# Patient Record
Sex: Female | Born: 1945
Health system: Southern US, Community
[De-identification: ages and names within clinical notes are randomized; demographics above are authoritative.]

## PROBLEM LIST (undated history)

## (undated) DIAGNOSIS — L509 Urticaria, unspecified: Secondary | ICD-10-CM

## (undated) DIAGNOSIS — J45909 Unspecified asthma, uncomplicated: Secondary | ICD-10-CM

## (undated) HISTORY — DX: Urticaria, unspecified: L50.9

## (undated) HISTORY — PX: OTHER SURGICAL HISTORY: SHX169

## (undated) HISTORY — PX: BREAST EXCISIONAL BIOPSY: SUR124

## (undated) HISTORY — PX: FOOT SURGERY: SHX648

## (undated) HISTORY — DX: Unspecified asthma, uncomplicated: J45.909

## (undated) HISTORY — PX: BREAST BIOPSY: SHX20

---

## 2013-09-29 ENCOUNTER — Encounter: Payer: Self-pay | Admitting: Gastroenterology

## 2016-04-24 DIAGNOSIS — J3089 Other allergic rhinitis: Secondary | ICD-10-CM | POA: Insufficient documentation

## 2016-04-24 DIAGNOSIS — J4489 Other specified chronic obstructive pulmonary disease: Secondary | ICD-10-CM | POA: Insufficient documentation

## 2016-04-24 DIAGNOSIS — K219 Gastro-esophageal reflux disease without esophagitis: Secondary | ICD-10-CM | POA: Insufficient documentation

## 2016-04-24 DIAGNOSIS — J449 Chronic obstructive pulmonary disease, unspecified: Secondary | ICD-10-CM | POA: Insufficient documentation

## 2016-05-21 DIAGNOSIS — E039 Hypothyroidism, unspecified: Secondary | ICD-10-CM | POA: Insufficient documentation

## 2016-05-21 DIAGNOSIS — E038 Other specified hypothyroidism: Secondary | ICD-10-CM | POA: Insufficient documentation

## 2017-06-04 DIAGNOSIS — Z86010 Personal history of colonic polyps: Secondary | ICD-10-CM | POA: Insufficient documentation

## 2018-04-03 ENCOUNTER — Encounter: Payer: Self-pay | Admitting: Gastroenterology

## 2018-12-18 DIAGNOSIS — M5412 Radiculopathy, cervical region: Secondary | ICD-10-CM | POA: Insufficient documentation

## 2018-12-30 ENCOUNTER — Telehealth: Payer: Self-pay | Admitting: Allergy

## 2018-12-30 NOTE — Telephone Encounter (Signed)
Spoke with patient and explained what to take and what not to take before apt.

## 2018-12-30 NOTE — Telephone Encounter (Signed)
PT called in to ask about which meds to stop taking for her NEW PT appt with Dr Selena BattenKim. The pharmacist told her to stop using the Fluticosine spray and Allegra, but was told to ask us about stopping the Singulair and [employer] Pepcid omeprazole. Please call back PT to advise. Ok to leave voicemail.

## 2019-01-09 ENCOUNTER — Ambulatory Visit (INDEPENDENT_AMBULATORY_CARE_PROVIDER_SITE_OTHER): Payer: Medicare Other | Admitting: Allergy

## 2019-01-09 ENCOUNTER — Encounter: Payer: Self-pay | Admitting: Allergy

## 2019-01-09 VITALS — BP 144/80 | HR 69 | Temp 98.1°F | Resp 18 | Ht 62.72 in | Wt 154.8 lb

## 2019-01-09 DIAGNOSIS — J310 Chronic rhinitis: Secondary | ICD-10-CM

## 2019-01-09 DIAGNOSIS — Z8709 Personal history of other diseases of the respiratory system: Secondary | ICD-10-CM | POA: Insufficient documentation

## 2019-01-09 DIAGNOSIS — Z130 Encounter for screening for diseases of the blood and blood-forming organs and certain disorders involving the immune mechanism: Secondary | ICD-10-CM | POA: Diagnosis not present

## 2019-01-09 DIAGNOSIS — J4540 Moderate persistent asthma, uncomplicated: Secondary | ICD-10-CM

## 2019-01-09 NOTE — Assessment & Plan Note (Signed)
Diagnosed with asthma about 8 years ago but the last year noticing frequent flares the past year requiring almost monthly prednisone and antibiotics since June 2019. Currently on Symbicort 160 2 puffs BID, albuterol prn, Flonase 2 sprays BID, allegra daily and Singulair daily. She follows with pulmonology. ENT evaluation showed reflux and now on PPI. 2019 CXR was unremarkable. No cardiac work up.   Today's spirometry was normal.  Today's skin testing was minimally positive to aspergillus and penicillium mix.   Discussed with patient that not sure if there is an allergic trigger to her worsening symptoms. Will get some additional bloodwork as below and will make recommendations based on results.  Give her frequent infections will also check some basic immune bloodwork.  Continue all your inhalers as per your pulmonologist.  Continue Singulair daily.  Continue Prilosec daily.

## 2019-01-09 NOTE — Patient Instructions (Addendum)
Today's testing showed: Mildly positive to mold.  Keep track of infections. Get bloodwork   Continue all your inhalers as per your pulmonologist. Continue singulair daily. Continue prilosec daily/ Continue allegra daily Continue Flonase 2 sprays daily  Follow up in 2 months  Mold Control . Mold and fungi can grow on a variety of surfaces provided certain temperature and moisture conditions exist.  . Outdoor molds grow on plants, decaying vegetation and soil. The major outdoor mold, Alternaria and Cladosporium, are found in very high numbers during hot and dry conditions. Generally, a late summer - fall peak is seen for common outdoor fungal spores. Rain will temporarily lower outdoor mold spore count, but counts rise rapidly when the rainy period ends. . The most important indoor molds are Aspergillus and Penicillium. Dark, humid and poorly ventilated basements are ideal sites for mold growth. The next most common sites of mold growth are the bathroom and the kitchen. Indoor (Perennial) Mold Control  . Maintain humidity below 50%. . Get rid of mold growth on hard surfaces with water, detergent and, if necessary, 5% bleach (do not mix with other cleaners). Then dry the area completely. If mold covers an area more than 10 square feet, consider hiring an indoor environmental professional. . For clothing, washing with soap and water is best. If moldy items cannot be cleaned and dried, throw them away. . Remove sources e.g. contaminated carpets. . Repair and seal leaking roofs or pipes. Using dehumidifiers in damp basements may be helpful, but empty the water and clean units regularly to prevent mildew from forming. All rooms, especially basements, bathrooms and kitchens, require ventilation and cleaning to deter mold and mildew growth. Avoid carpeting on concrete or damp floors, and storing items in damp areas.

## 2019-01-09 NOTE — Assessment & Plan Note (Signed)
.   See assessment and plan as above. 

## 2019-01-09 NOTE — Assessment & Plan Note (Signed)
Perennial nasal congestion for the past few years. Evaluated by ENT in the past.   Today's testing showed: Mildly positive to mold. Discussed environmental control measures.   Continue Singulair daily.  Continue allegra daily.  Continue Flonase 2 sprays daily.  Nasal saline spray (i.e., Simply Saline) or nasal saline lavage (i.e., NeilMed) is recommended as needed and prior to medicated nasal sprays.

## 2019-01-09 NOTE — Progress Notes (Signed)
New Patient Note  RE: Chloe Hoover MRN: [id number] DOB: February 01, 1946 Date of Office Visit: 01/09/2019  Referring provider: No ref. provider found Primary care provider: [name], MD  Chief Complaint: Referral (pulmonogist)  History of Present Illness: I had the pleasure of seeing Chloe Hoover for initial evaluation at the [facility] on 01/09/2019. She is a 73 y.o. female, who is referred here by [name], MD for the evaluation of recurrent breathing issues/allergy testing.  She reports symptoms of chest tightness, shortness of breath, coughing with thick phlegm, wheezing, nocturnal awakenings for 8 years but worsening symptoms the past year. Current medications include Symbicort 160 2 puffs BID and albuterol prn with minimal benefit. She reports not using aerochamber with asthma inhalers. She tried the following inhalers: breo, Advair. Main asthma triggers are unknown but worse with exertion. In the last month, frequency of asthma symptoms: depends on flares. Frequency of nocturnal symptoms: 0x/month. Frequency of SABA use: depends on flares. Interference with physical activity: yes. Sleep is disturbed. In the last 12 months, emergency room visits/urgent care visits/doctor office visits or hospitalizations due to asthma: multiple times at least once a month from June to December. In the last 12 months, oral steroids courses: multiple times 4-6?[name] Lifetime history of hospitalization for asthma: no. Prior intubations: no. History of pneumonia: no. She was evaluated pulmonologist in the past. Smoking exposure: yes, quit in 1985. Up to date with flu vaccine: yes. Up to date with pneumonia vaccine: yes.  No recent cardiac evaluation.   Recent CXR in 2019 was normal.  She reports symptoms of nasal congestion in the morning. Symptoms have been going on for a few years. The symptoms are present all year around with worsening in the fall and spring. Anosmia: no.  Headache: no.  She has used saline spray, Flonase 2 sprays BID, allegra, Singulair with some improvement in symptoms. Sinus infections: no. Previous work up includes: 2017 bloodwork was negative.  Previous ENT evaluation: yes but was told she had acid reflux. Previous sinus imaging: no.  Patient does have reflux and taking Prilosec and Pepcid BID with good benefit.   Assessment and Plan: Chloe Hoover is a 73 y.o. female with: Moderate persistent asthma without complication Diagnosed with asthma about 8 years ago but the last year noticing frequent flares the past year requiring almost monthly prednisone and antibiotics since June 2019. Currently on Symbicort 160 2 puffs BID, albuterol prn, Flonase 2 sprays BID, allegra daily and Singulair daily. She follows with pulmonology. ENT evaluation showed reflux and now on PPI. 2019 CXR was unremarkable. No cardiac work up.   Today's spirometry was normal.  Today's skin testing was minimally positive to aspergillus and penicillium mix.   Discussed with patient that not sure if there is an allergic trigger to her worsening symptoms. Will get some additional bloodwork as below and will make recommendations based on results.  Give her frequent infections will also check some basic immune bloodwork.  Continue all your inhalers as per your pulmonologist.  Continue Singulair daily.  Continue Prilosec daily.  History of frequent upper respiratory infection See assessment and plan as above.  Chronic rhinitis Perennial nasal congestion for the past few years. Evaluated by ENT in the past.   Today's testing showed: Mildly positive to mold. Discussed environmental control measures.   Continue Singulair daily.  Continue allegra daily.  Continue Flonase 2 sprays daily.  Nasal saline spray (i.e., Simply Saline) or nasal saline lavage (i.e., NeilMed) is  recommended as needed and prior to medicated nasal sprays.  Return in about 2 months (around  03/10/2019).  Lab Orders     CBC with Differential     IgG, IgA, IgM     Strep pneumoniae 23 Serotypes IgG     Tetanus antibody, IgG     [id number] Aspergillus fumigatus     IgE  Other allergy screening: Food allergy: no Medication allergy: yes  Codeine - drowsiness Hymenoptera allergy: no Urticaria: no Eczema:no History of recurrent infections suggestive of immunodeficency:   Patient has history multiple infections including bronchitis. Denies any sinus infection, pneumonia, ear infections, GI infections/diarrhea, skin infections/abscesses, fungal infections, viral infections.   Patient reports 6 antibiotic use in the last 12 months and 0 hospital admissions. Patient does not have any secondary causes of immunodeficiency including chronic steroid use, diabetes mellitus, protein losing enteropathy, renal or hepatic dysfunction, history of cancer or irradiation or history of HIV, hepatitis B or C. Patient did have melanoma of the skin in 2008  Diagnostics: Spirometry:  Tracings reviewed. Her effort: Good reproducible efforts. FVC: 2.71L FEV1: 2.08L, 96% predicted FEV1/FVC ratio: 77% Interpretation: Spirometry consistent with normal pattern.  Please see scanned spirometry results for details.  Skin Testing: Environmental allergy panel. Mildly positive to mold - aspergillus and penicillium mix. Results discussed with patient/family. Airborne Adult Perc - 01/09/19 0941    Time Antigen Placed  0945    Allergen Manufacturer  Waynette ButteryGreer    Location  Back    Number of Test  59    Panel 1  Select    1. Control-Buffer 50% Glycerol  Negative    2. Control-Histamine 1 mg/ml  4+    3. Albumin saline  Negative    4. Bahia  Negative    5. French Southern Territories[place]ermuda  Negative    6. Johnson  Negative    7. Kentucky Blue  Negative    8. Meadow Fescue  Negative    9. Perennial Rye  Negative    10. Sweet Vernal  Negative    11. Timothy  Negative    12. Cocklebur  Negative    13. Burweed Marshelder  Negative     14. Ragweed, short  Negative    15. Ragweed, Giant  Negative    16. Plantain,  English  Negative    17. Lamb's Quarters  Negative    18. Sheep Sorrell  Negative    19. Rough Pigweed  Negative    20. Marsh Elder, Rough  Negative    21. Mugwort, Common  Negative    22. Ash mix  Negative    23. Birch mix  Negative    24. Beech American  Negative    25. Box, Elder  Negative    26. Cedar, red  Negative    27. Cottonwood, Guinea-BissauEastern  Negative    28. Elm mix  Negative    29. Hickory mix  Negative    30. Maple mix  Negative    31. Oak, Guinea-Bissau[place]astern mix  Negative    32. Pecan Pollen  Negative    33. Pine mix  Negative    34. Sycamore Eastern  Negative    35. Walnut, Black Pollen  Negative    36. Alternaria alternata  Negative    37. Cladosporium Herbarum  Negative    38. Aspergillus mix  Negative    39. Penicillium mix  Negative    40. Bipolaris sorokiniana (Helminthosporium)  Negative    41. Drechslera spicifera (Curvularia)  Negative    42. Mucor plumbeus  Negative    43. Fusarium moniliforme  Negative    44. Aureobasidium pullulans (pullulara)  Negative    45. Rhizopus oryzae  Negative    46. Botrytis cinera  Negative    47. Epicoccum nigrum  Negative    48. Phoma betae  Negative    49. Candida Albicans  Negative    50. Trichophyton mentagrophytes  Negative    51. Mite, D Farinae  5,000 AU/ml  Negative    52. Mite, D Pteronyssinus  5,000 AU/ml  Negative    53. Cat Hair 10,000 BAU/ml  Negative    54.  Dog Epithelia  Negative    55. Mixed Feathers  Negative    56. Horse Epithelia  Negative    57. Cockroach, German  Negative    58. Mouse  Negative    59. Tobacco Leaf  Negative     Food Perc - 01/09/19 0941    Time Antigen Placed  0945    Allergen Manufacturer  Waynette Buttery    Location  Back    Number of allergen test  10    Food  Select    1. Peanut  Negative    2. Soybean food  Negative    3. Wheat, whole  Negative    4. Sesame  Negative    5. Milk, cow  Negative    6. Egg White,  chicken  Negative    7. Casein  Negative    8. Shellfish mix  Negative    9. Fish mix  Negative    10. Cashew  Negative     Intradermal - 01/09/19 1008    Time Antigen Placed  1010    Allergen Manufacturer  Greer    Location  Arm    Number of Test  15    Control  Negative    French Southern Territories  Negative    Johnson  Negative    7 Grass  Negative    Ragweed mix  Negative    Weed mix  Negative    Tree mix  Negative    Mold 1  Negative    Mold 2  2+    Mold 3  Negative    Mold 4  Negative    Cat  Negative    Dog  Negative    Cockroach  Negative    Mite mix  Negative       Past Medical History: Patient Active Problem List   Diagnosis Date Noted  . History of frequent upper respiratory infection 01/09/2019  . Encounter for screening for diseases of the blood and blood-forming organs and certain disorders involving the immune mechanism 01/09/2019  . Moderate persistent asthma without complication 01/09/2019  . Chronic rhinitis 01/09/2019   Past Medical History:  Diagnosis Date  . Asthma   . Urticaria    Past Surgical History: History reviewed. No pertinent surgical history. Medication List:  Current Outpatient Medications  Medication Sig Dispense Refill  . albuterol (PROVENTIL) (2.5 MG/3ML) 0.083% nebulizer solution Inhale into the lungs.    [name] albuterol (PROVENTIL) (2.5 MG/3ML) 0.083% nebulizer solution USE 3 ML IN NEBULIZER EVERY 6 HOURS AS NEEDED    . budesonide-formoterol (SYMBICORT) 160-4.5 MCG/ACT inhaler Inhale into the lungs.    . Cholecalciferol (VITAMIN D3) 50 MCG (2000 UT) capsule Take by mouth.    . fenofibrate 160 MG tablet TAKE 1 TABLET BY MOUTH ONCE DAILY    . fexofenadine (ALLEGRA) 180 MG tablet  TAKE 1 TABLET BY MOUTH ONCE DAILY    . fluticasone (FLONASE) 50 MCG/ACT nasal spray USE 2 SPRAY(S) IN EACH NOSTRIL TWICE DAILY    . montelukast (SINGULAIR) 10 MG tablet TAKE 1 TABLET BY MOUTH ONCE DAILY IN THE EVENING    . Multiple Vitamin (MULTIVITAMIN) capsule Take by  mouth.    [name] omeprazole (PRILOSEC) 20 MG capsule TAKE 1 CAPSULE BY MOUTH ONCE DAILY    . simvastatin (ZOCOR) 40 MG tablet TAKE ONE TABLET BY MOUTH ONCE DAILY IN THE EVENING     No current facility-administered medications for this visit.    Allergies: Allergies  Allergen Reactions  . Codeine Hives   Social History: Social History   Socioeconomic History  . Marital status: Single    Spouse name: Not on file  . Number of children: Not on file  . Years of education: Not on file  . Highest education level: Not on file  Occupational History  . Not on file  Social Needs  . Financial resource strain: Not on file  . Food insecurity:    Worry: Not on file    Inability: Not on file  . Transportation needs:    Medical: Not on file    Non-medical: Not on file  Tobacco Use  . Smoking status: Former Smoker    Types: Cigarettes    Last attempt to quit: 07/17/1984    Years since quitting: 34.5  . Smokeless tobacco: Never Used  Substance and Sexual Activity  . Alcohol use: Not on file  . Drug use: Not on file  . Sexual activity: Not on file  Lifestyle  . Physical activity:    Days per week: Not on file    Minutes per session: Not on file  . Stress: Not on file  Relationships  . Social connections:    Talks on phone: Not on file    Gets together: Not on file    Attends religious service: Not on file    Active member of club or organization: Not on file    Attends meetings of clubs or organizations: Not on file    Relationship status: Not on file  Other Topics Concern  . Not on file  Social History Narrative  . Not on file   Lives in a 1950s built home. Smoking: smoked from 1964 to 1985.  Occupation: retired in 2012  Environmental History: Water Damage/mildew in the house: yes Carpet in the family room: yes Carpet in the bedroom: yes Heating: gas Cooling: central Pet: no  Family History: Family History  Problem Relation Age of Onset  . Allergic rhinitis Brother   .  Asthma Neg Hx   . Eczema Neg Hx   . Urticaria Neg Hx    Review of Systems  Constitutional: Negative for appetite change, chills, fever and unexpected weight change.  HENT: Positive for congestion. Negative for rhinorrhea.   Eyes: Negative for itching.  Respiratory: Positive for cough, chest tightness and shortness of breath. Negative for wheezing.   Cardiovascular: Negative for chest pain.  Gastrointestinal: Negative for abdominal pain.  Genitourinary: Negative for difficulty urinating.  Skin: Negative for rash.  Allergic/Immunologic: Positive for environmental allergies. Negative for food allergies.  Neurological: Negative for headaches.   Objective: BP (!) 144/80 (BP Location: Right Arm, Patient Position: Sitting, Cuff Size: Normal)   Pulse 69   Temp 98.1 F (36.7 C) (Oral)   Resp 18   Ht 5' 2.72" (1.593 m)   Wt 154 lb 12.8 oz (  70.2 kg)   SpO2 95%   BMI 27.67 kg/m  Body mass index is 27.67 kg/m. Physical Exam  Constitutional: She is oriented to person, place, and time. She appears well-developed and well-nourished.  HENT:  Head: Normocephalic and atraumatic.  Right Ear: External ear normal.  Left Ear: External ear normal.  Nose: Nose normal.  Mouth/Throat: Oropharynx is clear and moist.  Eyes: Conjunctivae and EOM are normal.  Neck: Neck supple.  Cardiovascular: Normal rate, regular rhythm and normal heart sounds. Exam reveals no gallop and no friction rub.  No murmur heard. Pulmonary/Chest: Effort normal and breath sounds normal. She has no wheezes. She has no rales.  Abdominal: Soft.  Lymphadenopathy:    She has no cervical adenopathy.  Neurological: She is alert and oriented to person, place, and time.  Skin: Skin is warm. No rash noted.  Psychiatric: She has a normal mood and affect. Her behavior is normal.  Nursing note and vitals reviewed.  The plan was reviewed with the patient/family, and all questions/concerned were addressed.  It was my pleasure to see  Chloe DandyMary today and participate in her care. Please feel free to contact me with any questions or concerns.  Sincerely,  Wyline MoodYoon Kim, DO Allergy & Immunology  [facility] [place] office: [phone] [facility] office: [phone]

## 2019-01-12 LAB — M003-IGE ASPERGILLUS FUMIGATUS: Aspergillus Fumigatus IgE: <0.1 kU/L

## 2019-01-12 LAB — IGE: IgE (Immunoglobulin E), Serum: 42 IU/mL (ref 6–495)

## 2019-01-16 ENCOUNTER — Ambulatory Visit (INDEPENDENT_AMBULATORY_CARE_PROVIDER_SITE_OTHER): Payer: Medicare Other | Admitting: Allergy

## 2019-01-16 ENCOUNTER — Encounter: Payer: Self-pay | Admitting: Allergy

## 2019-01-16 VITALS — BP 120/62 | HR 66 | Temp 98.4°F | Resp 20

## 2019-01-16 DIAGNOSIS — J069 Acute upper respiratory infection, unspecified: Secondary | ICD-10-CM

## 2019-01-16 DIAGNOSIS — Z8709 Personal history of other diseases of the respiratory system: Secondary | ICD-10-CM

## 2019-01-16 DIAGNOSIS — J4540 Moderate persistent asthma, uncomplicated: Secondary | ICD-10-CM

## 2019-01-16 DIAGNOSIS — J310 Chronic rhinitis: Secondary | ICD-10-CM | POA: Diagnosis not present

## 2019-01-16 NOTE — Assessment & Plan Note (Signed)
Past history - Diagnosed with asthma about 8 years ago but the last year noticing frequent flares the past year requiring almost monthly prednisone and antibiotics since June 2019. Currently on Symbicort 160 2 puffs BID, albuterol prn, Flonase 2 sprays BID, allegra daily and Singulair daily. She follows with pulmonology. ENT evaluation showed reflux and now on PPI. 2019 CXR was unremarkable. No cardiac work up.  Interim history - No flare with current URI.   Today's spirometry was normal.  Continue all your inhalers as per your pulmonologist.  Continue Singulair daily.  Continue Prilosec daily.

## 2019-01-16 NOTE — Assessment & Plan Note (Signed)
I did not get back all the bloodwork and will call once they all have resulted.  Keep track of infections.

## 2019-01-16 NOTE — Patient Instructions (Addendum)
You most likely have a common viral cold.  You may take a decongestant such as allegra-D for the next few days to help with the congestion.  Use saline nettipot 1-2 times a day for the next week or so until everything clears up.  Continue Flonase 2 sprays daily.  Moderate persistent asthma without complication  Continue all your inhalers as per your pulmonologist.  Continue Singulair daily.  Continue Prilosec daily.  Follow up in 2 months   Drink plenty of fluids.  Water, juice, clear broth or warm lemon water are good choices. Avoid caffeine and alcohol, which can dehydrate you.  Eat chicken soup.  Chicken soup and other warm fluids can be soothing and loosen congestion.  Rest.  Adjust your room's temperature and humidity.  Keep your room warm but not overheated. If the air is dry, a cool-mist humidifier or vaporizer can moisten the air and help ease congestion and coughing. Keep the humidifier clean to prevent the growth of bacteria and molds.  Soothe your throat.  Perform a saltwater gargle. Dissolve one-quarter to a half teaspoon of salt in a 4- to 8-ounce glass of warm water. This can relieve a sore or scratchy throat temporarily.  Use saline nasal drops.  To help relieve nasal congestion, try saline nasal drops. You can buy these drops over the counter, and they can help relieve symptoms ? even in children.  Take over-the-counter cold and cough medications.  For adults and children older than 5, over-the-counter decongestants, antihistamines and pain relievers might offer some symptom relief. However, they won't prevent a cold or shorten its duration.

## 2019-01-16 NOTE — Progress Notes (Signed)
Follow Up Note  RE: Chloe Hoover MRN: [id number] DOB: 12-02-1946 Date of Office Visit: 01/16/2019  Referring provider: Brooke BonitoGallemore, Warren, MD Primary care provider: Brooke BonitoGallemore, Warren, MD  Chief Complaint: Asthma  History of Present Illness: I had the pleasure of seeing Chloe Hoover for a follow up visit at the [facility] on 01/16/2019. She is a 73 y.o. female, who is being followed for frequent URIs, asthma, chronic rhinitis. Today she is here for new complaint of URI symptoms. Her previous allergy office visit was on 01/09/2019 with Dr. Selena BattenKim.   On Sunday afternoon patient was watching TV with her family when she noticed some tickling in her throat and started coughing. No fevers but on Monday she had a dry cough with sore throat and stuffy nose. On Tuesday patient had a fever of 101.8 and she had some headaches, sneezing.  She does not feel like her lungs are inflamed but seems very stuffy in her sinuses and ears. No more fevers. No sick contacts. Symptoms seem to be getting better.   Currently on Singulair, allegra, nasal spray 2 sprays daily and all inhalers.  Assessment and Plan: [name] is a 73 y.o. female with: Viral upper respiratory infection Patient most likely has a viral URI. No indication for any antibiotics.   Gave handout on proper care.  May take a decongestant such as allegra-D for the next few days to help with the congestion.  Use saline nettipot 1-2 times a day for the next week or so until everything clears up.  Continue Flonase 2 sprays daily.  History of frequent upper respiratory infection I did not get back all the bloodwork and will call once they all have resulted.  Keep track of infections.  Chronic rhinitis Past history - Perennial nasal congestion for the past few years. Evaluated by ENT in the past. 2020 testing showed: Mildly positive to mold.   Continue Singulair daily.  Continue allegra daily.  Continue Flonase 2 sprays  daily.  Nasal saline spray (i.e., Simply Saline) or nasal saline lavage (i.e., NeilMed) is recommended as needed and prior to medicated nasal sprays.  Moderate persistent asthma without complication Past history - Diagnosed with asthma about 8 years ago but the last year noticing frequent flares the past year requiring almost monthly prednisone and antibiotics since June 2019. Currently on Symbicort 160 2 puffs BID, albuterol prn, Flonase 2 sprays BID, allegra daily and Singulair daily. She follows with pulmonology. ENT evaluation showed reflux and now on PPI. 2019 CXR was unremarkable. No cardiac work up.  Interim history - No flare with current URI.   Today's spirometry was normal.  Continue all your inhalers as per your pulmonologist.  Continue Singulair daily.  Continue Prilosec daily.  Return in about 2 months (around 03/17/2019).  Diagnostics: Spirometry:  Tracings reviewed. Her effort: Good reproducible efforts. FVC: 2.84L FEV1: 2.25L, 104% predicted FEV1/FVC ratio: 79% Interpretation: Spirometry consistent with normal pattern.  Please see scanned spirometry results for details.  Medication List:  Current Outpatient Medications  Medication Sig Dispense Refill  . albuterol (PROVENTIL) (2.5 MG/3ML) 0.083% nebulizer solution USE 3 ML IN NEBULIZER EVERY 6 HOURS AS NEEDED    . budesonide-formoterol (SYMBICORT) 160-4.5 MCG/ACT inhaler Inhale 2 puffs into the lungs 2 (two) times daily.     . Cholecalciferol (VITAMIN D3) 50 MCG (2000 UT) capsule Take by mouth.    . fenofibrate 160 MG tablet TAKE 1 TABLET BY MOUTH ONCE DAILY    . fexofenadine (  ALLEGRA) 180 MG tablet Take 180 mg by mouth daily.     . fluticasone (FLONASE) 50 MCG/ACT nasal spray USE 2 SPRAY(S) IN EACH NOSTRIL TWICE DAILY    . montelukast (SINGULAIR) 10 MG tablet TAKE 1 TABLET BY MOUTH ONCE DAILY IN THE EVENING    . Multiple Vitamin (MULTIVITAMIN) capsule Take by mouth.    [name]. omeprazole (PRILOSEC) 20 MG capsule TAKE 1  CAPSULE BY MOUTH ONCE DAILY    . simvastatin (ZOCOR) 40 MG tablet TAKE ONE TABLET BY MOUTH ONCE DAILY IN THE EVENING     No current facility-administered medications for this visit.    Allergies: Allergies  Allergen Reactions  . Codeine Hives   I reviewed her past medical history, social history, family history, and environmental history and no significant changes have been reported from previous visit on 01/09/2019.  Review of Systems  Constitutional: Negative for appetite change, chills, fever and unexpected weight change.  HENT: Positive for congestion. Negative for rhinorrhea.   Eyes: Negative for itching.  Respiratory: Positive for cough. Negative for chest tightness, shortness of breath and wheezing.   Cardiovascular: Negative for chest pain.  Gastrointestinal: Negative for abdominal pain.  Genitourinary: Negative for difficulty urinating.  Skin: Negative for rash.  Allergic/Immunologic: Positive for environmental allergies. Negative for food allergies.  Neurological: Negative for headaches.   Objective: BP 120/62   Pulse 66   Temp 98.4 F (36.9 C) (Oral)   Resp 20   SpO2 95%  There is no height or weight on file to calculate BMI. Physical Exam  Constitutional: She is oriented to person, place, and time. She appears well-developed and well-nourished.  HENT:  Head: Normocephalic and atraumatic.  Right Ear: External ear normal.  Left Ear: External ear normal.  Nose: Nose normal.  Mouth/Throat: Oropharynx is clear and moist.  Eyes: Conjunctivae and EOM are normal.  Neck: Neck supple.  Cardiovascular: Normal rate, regular rhythm and normal heart sounds. Exam reveals no gallop and no friction rub.  No murmur heard. Pulmonary/Chest: Effort normal and breath sounds normal. She has no wheezes. She has no rales.  Abdominal: Soft.  Lymphadenopathy:    She has no cervical adenopathy.  Neurological: She is alert and oriented to person, place, and time.  Skin: Skin is warm.  No rash noted.  Psychiatric: She has a normal mood and affect. Her behavior is normal.  Nursing note and vitals reviewed.  Previous notes and tests were reviewed. The plan was reviewed with the patient/family, and all questions/concerned were addressed.  It was my pleasure to see [name] today and participate in her care. Please feel free to contact me with any questions or concerns.  Sincerely,  Wyline MoodYoon Kim, DO Allergy & Immunology  [facility] [place] office: [phone] [facility] office: [phone]

## 2019-01-16 NOTE — Assessment & Plan Note (Addendum)
Patient most likely has a viral URI. No indication for any antibiotics.   Gave handout on proper care.  May take a decongestant such as allegra-D for the next few days to help with the congestion.  Use saline nettipot 1-2 times a day for the next week or so until everything clears up.  Continue Flonase 2 sprays daily.

## 2019-01-16 NOTE — Assessment & Plan Note (Signed)
Past history - Perennial nasal congestion for the past few years. Evaluated by ENT in the past. 2020 testing showed: Mildly positive to mold.   Continue Singulair daily.  Continue allegra daily.  Continue Flonase 2 sprays daily.  Nasal saline spray (i.e., Simply Saline) or nasal saline lavage (i.e., NeilMed) is recommended as needed and prior to medicated nasal sprays.

## 2019-01-20 LAB — STREP PNEUMONIAE 23 SEROTYPES IGG
PNEUMO AB TYPE 70 (33F): 1.3 ug/mL — AB (ref >1.3)
Pneumo Ab Type 1*: 5.4 ug/mL (ref >1.3)
Pneumo Ab Type 12 (12F)*: <0.1 ug/mL — ABNORMAL LOW (ref >1.3)
Pneumo Ab Type 14*: 2.3 ug/mL (ref >1.3)
Pneumo Ab Type 17 (17F)*: 1.5 ug/mL (ref >1.3)
Pneumo Ab Type 19 (19F)*: 2 ug/mL (ref >1.3)
Pneumo Ab Type 2*: 2.2 ug/mL (ref >1.3)
Pneumo Ab Type 20*: 3.6 ug/mL (ref >1.3)
Pneumo Ab Type 22 (22F)*: 0.5 ug/mL — ABNORMAL LOW (ref >1.3)
Pneumo Ab Type 23 (23F)*: 0.3 ug/mL — ABNORMAL LOW (ref >1.3)
Pneumo Ab Type 26 (6B)*: 1.4 ug/mL (ref >1.3)
Pneumo Ab Type 3*: 4.4 ug/mL (ref >1.3)
Pneumo Ab Type 34 (10A)*: 2.2 ug/mL (ref >1.3)
Pneumo Ab Type 4*: 1.8 ug/mL (ref >1.3)
Pneumo Ab Type 43 (11A)*: 2.9 ug/mL (ref >1.3)
Pneumo Ab Type 5*: 18.4 ug/mL (ref >1.3)
Pneumo Ab Type 51 (7F)*: 2 ug/mL (ref >1.3)
Pneumo Ab Type 54 (15B)*: 3.1 ug/mL (ref >1.3)
Pneumo Ab Type 56 (18C)*: 4.6 ug/mL (ref >1.3)
Pneumo Ab Type 57 (19A)*: 2.9 ug/mL (ref >1.3)
Pneumo Ab Type 68 (9V)*: 5.7 ug/mL (ref >1.3)
Pneumo Ab Type 8*: 0.4 ug/mL — ABNORMAL LOW (ref >1.3)
Pneumo Ab Type 9 (9N)*: 2.3 ug/mL (ref >1.3)

## 2019-01-20 LAB — CBC WITH DIFFERENTIAL/PLATELET
Basophils Absolute: 0.1 10*3/uL (ref 0.0–0.2)
Basos: 1 %
EOS (ABSOLUTE): 0.9 10*3/uL — ABNORMAL HIGH (ref 0.0–0.4)
Eos: 12 %
Hematocrit: 38.8 % (ref 34.0–46.6)
Hemoglobin: 12.7 g/dL (ref 11.1–15.9)
Immature Grans (Abs): 0 10*3/uL (ref 0.0–0.1)
Immature Granulocytes: 0 %
Lymphocytes Absolute: 2.2 10*3/uL (ref 0.7–3.1)
Lymphs: 30 %
MCH: 28.4 pg (ref 26.6–33.0)
MCHC: 32.7 g/dL (ref 31.5–35.7)
MCV: 87 fL (ref 79–97)
Monocytes Absolute: 0.5 10*3/uL (ref 0.1–0.9)
Monocytes: 7 %
Neutrophils Absolute: 3.6 10*3/uL (ref 1.4–7.0)
Neutrophils: 50 %
Platelets: 352 10*3/uL (ref 150–450)
RBC: 4.47 x10E6/uL (ref 3.77–5.28)
RDW: 13.7 % (ref 11.7–15.4)
WBC: 7.2 10*3/uL (ref 3.4–10.8)

## 2019-01-20 LAB — IGG, IGA, IGM
IgA/Immunoglobulin A, Serum: 169 mg/dL (ref 87–352)
IgG (Immunoglobin G), Serum: 747 mg/dL (ref 700–1600)
IgM (Immunoglobulin M), Srm: 90 mg/dL (ref 26–217)

## 2019-01-20 LAB — TETANUS ANTIBODY, IGG: Tetanus Ab, IgG: 0.64 IU/mL (ref <0.10)

## 2019-01-21 ENCOUNTER — Encounter: Payer: Self-pay | Admitting: Allergy

## 2019-03-13 ENCOUNTER — Ambulatory Visit: Payer: Medicare Other | Admitting: Allergy

## 2019-05-01 ENCOUNTER — Other Ambulatory Visit: Payer: Self-pay

## 2019-05-01 ENCOUNTER — Encounter: Payer: Self-pay | Admitting: Allergy

## 2019-05-01 ENCOUNTER — Ambulatory Visit (INDEPENDENT_AMBULATORY_CARE_PROVIDER_SITE_OTHER): Payer: Medicare Other | Admitting: Allergy

## 2019-05-01 DIAGNOSIS — J310 Chronic rhinitis: Secondary | ICD-10-CM | POA: Diagnosis not present

## 2019-05-01 DIAGNOSIS — Z8709 Personal history of other diseases of the respiratory system: Secondary | ICD-10-CM

## 2019-05-01 DIAGNOSIS — J4540 Moderate persistent asthma, uncomplicated: Secondary | ICD-10-CM | POA: Diagnosis not present

## 2019-05-01 NOTE — Assessment & Plan Note (Signed)
No infections/antibiotics since last OV. 2020 bloodwork - Basic immune evaluation was unremarkable.   Keep track of infections.

## 2019-05-01 NOTE — Assessment & Plan Note (Signed)
Past history - Diagnosed with asthma about 8 years ago but the last year noticing frequent flares the past year requiring almost monthly prednisone and antibiotics since June 2019. Currently on Symbicort 160 2 puffs BID, albuterol prn, Flonase 2 sprays BID, allegra daily and Singulair daily. She follows with pulmonology. ENT evaluation showed reflux and now on PPI. 2019 CXR was unremarkable. No cardiac work up.  Interim history - well-controlled and no flares since last OV.  . Daily controller medication(s): continue Symbicort 160 2 puffs twice a day with spacer and rinse mouth afterwards. . Prior to physical activity: May use albuterol rescue inhaler 2 puffs 5 to 15 minutes prior to strenuous physical activities. [name] Rescue medications: May use albuterol rescue inhaler 2 puffs or nebulizer every 4 to 6 hours as needed for shortness of breath, chest tightness, coughing, and wheezing. Monitor frequency of use.  . Get spirometry at next visit. . If having flares again requiring prednisone then will discuss starting asthma biologics at next visit.

## 2019-05-01 NOTE — Progress Notes (Signed)
RE: Chloe Hoover MRN: [id number] DOB: 02/10/1946 Date of Telemedicine Visit: 05/01/2019  Referring provider: [name], MD Primary care provider: [name], MD  Chief Complaint: Asthma   Telemedicine Follow Up Visit via Telephone: I connected with Chloe Hoover for a follow up on 05/01/19 by telephone and verified that I am speaking with the correct person using two identifiers.   I discussed the limitations, risks, security and privacy concerns of performing an evaluation and management service by telephone and the availability of in person appointments. I also discussed with the patient that there may be a patient responsible charge related to this service. The patient expressed understanding and agreed to proceed.  Patient is at home. Provider is at the office.  Visit start time: 10:38AM Visit end time: 11:04AM Insurance consent/check in by: [name] Medical consent and medical assistant/nurse: [name].  History of Present Illness: She is a 73 y.o. female, who is being followed for history of recurrent upper respiratory infection, chronic rhinitis and asthma. Her previous allergy office visit was on 01/16/2019 with [name]. Today is a regular follow up visit.  History of frequent upper respiratory infection No infections/antibiotics since the last OV.  Chronic rhinitis Currently on Singulair daily and allegra daily, Flonase 2 sprays twice a day. No nosebleeds.   Moderate persistent asthma without complication Currently on Symbicort 160 2 puffs BID. No albuterol use since the last visit.   Denies any SOB, coughing, wheezing, chest tightness, nocturnal awakenings, ER/urgent care visits or prednisone use since the last visit.  Currently on Pepcid daily.   She has been staying home for the most part since the COVID-19 pandemic. Daughter has been getting groceries for her.   Assessment and Plan: [name] is a 73 y.o. female with: Moderate persistent asthma  without complication Past history - Diagnosed with asthma about 8 years ago but the last year noticing frequent flares the past year requiring almost monthly prednisone and antibiotics since June 2019. Currently on Symbicort 160 2 puffs BID, albuterol prn, Flonase 2 sprays BID, allegra daily and Singulair daily. She follows with pulmonology. ENT evaluation showed reflux and now on PPI. 2019 CXR was unremarkable. No cardiac work up.  Interim history - well-controlled and no flares since last OV.  . Daily controller medication(s): continue Symbicort 160 2 puffs twice a day with spacer and rinse mouth afterwards. . Prior to physical activity: May use albuterol rescue inhaler 2 puffs 5 to 15 minutes prior to strenuous physical activities. [name] Rescue medications: May use albuterol rescue inhaler 2 puffs or nebulizer every 4 to 6 hours as needed for shortness of breath, chest tightness, coughing, and wheezing. Monitor frequency of use.  . Get spirometry at next visit. . If having flares again requiring prednisone then will discuss starting asthma biologics at next visit.  History of frequent upper respiratory infection No infections/antibiotics since last OV. 2020 bloodwork - Basic immune evaluation was unremarkable.   Keep track of infections.  Chronic rhinitis Past history - Perennial nasal congestion for the past few years. Evaluated by ENT in the past. 2020 testing showed: Mildly positive to mold.  Interim history - Doing well with below regimen. 2020 testing showed: Mildly positive to mold.   Continue Singulair daily.   Continue allegra daily.  Decrease Flonase to 1 spray twice a day.   Nasal saline spray (i.e., Simply Saline) or nasal saline lavage (i.e., NeilMed) is recommended as needed and prior to medicated nasal sprays.  Return in about  3 months (around 08/01/2019).  Diagnostics: None.  Medication List:  Current Outpatient Medications  Medication Sig Dispense Refill  . albuterol  (PROVENTIL) (2.5 MG/3ML) 0.083% nebulizer solution USE 3 ML IN NEBULIZER EVERY 6 HOURS AS NEEDED    . ALBUTEROL IN Inhale into the lungs as needed.    . budesonide-formoterol (SYMBICORT) 160-4.5 MCG/ACT inhaler Inhale 2 puffs into the lungs 2 (two) times daily.     . Cholecalciferol (VITAMIN D3) 50 MCG (2000 UT) capsule Take by mouth.    . famotidine (PEPCID) 20 MG tablet Take 20 mg by mouth 2 (two) times daily.    . fenofibrate 160 MG tablet TAKE 1 TABLET BY MOUTH ONCE DAILY    . fexofenadine (ALLEGRA) 180 MG tablet Take 180 mg by mouth daily.     . fluticasone (FLONASE) 50 MCG/ACT nasal spray USE 2 SPRAY(S) IN EACH NOSTRIL TWICE DAILY    . montelukast (SINGULAIR) 10 MG tablet TAKE 1 TABLET BY MOUTH ONCE DAILY IN THE EVENING    . Multiple Vitamin (MULTIVITAMIN) capsule Take by mouth.    [name]. omeprazole (PRILOSEC) 20 MG capsule TAKE 1 CAPSULE BY MOUTH ONCE DAILY    . simvastatin (ZOCOR) 40 MG tablet TAKE ONE TABLET BY MOUTH ONCE DAILY IN THE EVENING     No current facility-administered medications for this visit.    Allergies: Allergies  Allergen Reactions  . Codeine Hives   I reviewed her past medical history, social history, family history, and environmental history and no significant changes have been reported from previous visit on 01/16/2019.  Review of Systems  Constitutional: Negative for appetite change, chills, fever and unexpected weight change.  HENT: Negative for congestion and rhinorrhea.   Eyes: Negative for itching.  Respiratory: Negative for cough, chest tightness, shortness of breath and wheezing.   Cardiovascular: Negative for chest pain.  Gastrointestinal: Negative for abdominal pain.  Genitourinary: Negative for difficulty urinating.  Skin: Negative for rash.  Allergic/Immunologic: Positive for environmental allergies. Negative for food allergies.  Neurological: Negative for headaches.   Objective: Physical Exam Not obtained as encounter was done via telephone.    Previous notes and tests were reviewed.  I discussed the assessment and treatment plan with the patient. The patient was provided an opportunity to ask questions and all were answered. The patient agreed with the plan and demonstrated an understanding of the instructions. After visit summary/patient instructions available via mychart.   The patient was advised to call back or seek an in-person evaluation if the symptoms worsen or if the condition fails to improve as anticipated.  I provided 26 minutes of non-face-to-face time during this encounter.  It was my pleasure to participate in HollinsMary Volk's care today. Please feel free to contact me with any questions or concerns.   Sincerely,  Wyline MoodYoon , DO Allergy & Immunology  [facility] [place] office: [phone] [facility] office: [phone]

## 2019-05-01 NOTE — Patient Instructions (Addendum)
History of frequent upper respiratory infection  Keep track of infections.  Chronic rhinitis 2020 testing showed: Mildly positive to mold.   Continue Singulair daily.   Continue allegra daily.  Decrease Flonase 1 spray twice a day.   Nasal saline spray (i.e., Simply Saline) or nasal saline lavage (i.e., NeilMed) is recommended as needed and prior to medicated nasal sprays.  Moderate persistent asthma without complication . Daily controller medication(s): continue Symbicort 160 2 puffs twice a day with spacer and rinse mouth afterwards. . Prior to physical activity: May use albuterol rescue inhaler 2 puffs 5 to 15 minutes prior to strenuous physical activities. [name] Rescue medications: May use albuterol rescue inhaler 2 puffs or nebulizer every 4 to 6 hours as needed for shortness of breath, chest tightness, coughing, and wheezing. Monitor frequency of use.  . Asthma control goals:  o Full participation in all desired activities (may need albuterol before activity) o Albuterol use two times or less a week on average (not counting use with activity) o Cough interfering with sleep two times or less a month o Oral steroids no more than once a year o No hospitalizations  Follow up in 3 months.

## 2019-05-01 NOTE — Assessment & Plan Note (Signed)
Past history - Perennial nasal congestion for the past few years. Evaluated by ENT in the past. 2020 testing showed: Mildly positive to mold.  Interim history - Doing well with below regimen. 2020 testing showed: Mildly positive to mold.   Continue Singulair daily.   Continue allegra daily.  Decrease Flonase to 1 spray twice a day.   Nasal saline spray (i.e., Simply Saline) or nasal saline lavage (i.e., NeilMed) is recommended as needed and prior to medicated nasal sprays.

## 2019-07-01 ENCOUNTER — Telehealth: Payer: Self-pay | Admitting: Allergy

## 2019-07-01 NOTE — Telephone Encounter (Signed)
Noorah Giammona called to ask if we had a sample of symbicort. She said Dr.Kim told her to check with [place]. We have one box. I put it up front for her and asked Morey Hummingbird to log it for me. Her daughter Threasa Beards will pick up.

## 2019-07-07 ENCOUNTER — Telehealth: Payer: Self-pay | Admitting: Family Medicine

## 2019-07-07 MED ORDER — OMEPRAZOLE 20 MG PO CPDR: 20.0000 mg | DELAYED_RELEASE_CAPSULE | Freq: Every day | ORAL | 3 refills | Status: AC

## 2019-07-07 MED ORDER — SIMVASTATIN 40 MG PO TABS: ORAL_TABLET | ORAL | 3 refills | Status: DC

## 2019-07-07 MED ORDER — FENOFIBRATE 160 MG PO TABS: 160.0000 mg | ORAL_TABLET | Freq: Every day | ORAL | 3 refills | Status: DC

## 2019-07-07 NOTE — Telephone Encounter (Signed)
Medication Refill - Medication: simvastatin (ZOCOR) 40 MG tablet (90 day supply),fenofibrate 160 MG tablet ,omeprazole (PRILOSEC) 20 MG capsule (90 day supply)     Has the patient contacted their pharmacy? yes (Agent: If no, request that the patient contact the pharmacy for the refill.) (Agent: If yes, when and what did the pharmacy advise?)Contact PCP  Preferred Pharmacy (with phone number or street name):  [facility], [place] [phone] (Phone) [phone] (Fax)     Agent: Please be advised that RX refills may take up to 3 business days. We ask that you follow-up with your pharmacy.

## 2019-07-07 NOTE — Telephone Encounter (Signed)
Dr. Loletha Grayer please advise, pt has never been seen by you her first establish care appointment is 07/23/2019. Ok to refill?

## 2019-07-07 NOTE — Telephone Encounter (Signed)
Prescriptions filled and pt aware.

## 2019-07-07 NOTE — Telephone Encounter (Signed)
Yes ok to refill 90 day supply with 3 RF. Thank you!

## 2019-07-22 ENCOUNTER — Telehealth: Payer: Self-pay

## 2019-07-22 NOTE — Telephone Encounter (Signed)
Questions for Screening COVID-19  Symptom onset: n/a  Travel or Contacts: no  During this illness, did/does the patient experience any of the following symptoms? Fever >100.39F []   Yes [x]   No []   Unknown Subjective fever (felt feverish) []   Yes [x]   No []   Unknown Chills []   Yes [x]   No []   Unknown Muscle aches (myalgia) []   Yes [x]   No []   Unknown Runny nose (rhinorrhea) []   Yes [x]   No []   Unknown Sore throat []   Yes [x]   No []   Unknown Cough (new onset or worsening of chronic cough) []   Yes [x]   No []   Unknown Shortness of breath (dyspnea) []   Yes [x]   No []   Unknown Nausea or vomiting []   Yes [x]   No []   Unknown Headache []   Yes [x]   No []   Unknown Abdominal pain  []   Yes [x]   No []   Unknown Diarrhea (?3 loose/looser than normal stools/24hr period) []   Yes [x]   No []   Unknown Other, specify:  Patient risk factors: Smoker? []   Current []   Former []   Never If female, currently pregnant? []   Yes []   No  Patient Active Problem List   Diagnosis Date Noted  . Viral upper respiratory infection 01/16/2019  . History of frequent upper respiratory infection 01/09/2019  . Encounter for screening for diseases of the blood and blood-forming organs and certain disorders involving the immune mechanism 01/09/2019  . Moderate persistent asthma without complication 06/15/1600  . Chronic rhinitis 01/09/2019    Plan:  []   High risk for COVID-19 with red flags go to ED (with CP, SOB, weak/lightheaded, or fever > 101.5). Call ahead.  []   High risk for COVID-19 but stable. Inform provider and coordinate time for [facility] visit.   []   No red flags but URI signs or symptoms okay for [facility] visit.

## 2019-07-23 ENCOUNTER — Encounter: Payer: Self-pay | Admitting: Family Medicine

## 2019-07-23 ENCOUNTER — Ambulatory Visit (INDEPENDENT_AMBULATORY_CARE_PROVIDER_SITE_OTHER): Payer: Medicare Other | Admitting: Family Medicine

## 2019-07-23 ENCOUNTER — Encounter: Payer: Self-pay | Admitting: Internal Medicine

## 2019-07-23 VITALS — BP 110/84 | HR 61 | Temp 98.5°F | Ht 62.21 in | Wt 154.6 lb

## 2019-07-23 DIAGNOSIS — Z1239 Encounter for other screening for malignant neoplasm of breast: Secondary | ICD-10-CM | POA: Diagnosis not present

## 2019-07-23 DIAGNOSIS — Z13820 Encounter for screening for osteoporosis: Secondary | ICD-10-CM | POA: Diagnosis not present

## 2019-07-23 DIAGNOSIS — Z0000 Encounter for general adult medical examination without abnormal findings: Secondary | ICD-10-CM

## 2019-07-23 DIAGNOSIS — J4540 Moderate persistent asthma, uncomplicated: Secondary | ICD-10-CM

## 2019-07-23 DIAGNOSIS — Z23 Encounter for immunization: Secondary | ICD-10-CM | POA: Diagnosis not present

## 2019-07-23 DIAGNOSIS — Z1159 Encounter for screening for other viral diseases: Secondary | ICD-10-CM

## 2019-07-23 NOTE — Progress Notes (Signed)
Chloe Hoover is a 73 y.o. female  Chief Complaint  Patient presents with  . Establish Care    est care/ CPE- not fasting     HPI: Chloe BetterMary Ann Hoover is a 73 y.o. female here to establish care with our office. She is divorced, 2 grown daughters, 71yo grandson. Her previous PCP was with [facility], most recently Dr. Brynda RimGallemore.  She is due for CPE, labs. She is not fasting today and will RTO for lab appt.  She is due for Tdap, last Td in 2006.   Specialists: pulmonary (Dr. Su MonksEjaz [facility]), allergy and asthma (Dr. Wyline MoodYoon Kim), [facility] (h/o melanoma on back in 2009)   Last mammo: 09/2018 (report scanned into chart) - h/o lumpectomy 1995 (Lt breast) - benign Last Dexa: 2018 - due  Last colonoscopy: 03/2018 - due in 03/2021 (report scanned into chart)  Med refills needed today: none   Past Medical History:  Diagnosis Date  . Asthma   . Urticaria     Past Surgical History:  Procedure Laterality Date  . BREAST LUMPECTOMY     left  . FOOT SURGERY    . mylenoma removal      Social History   Socioeconomic History  . Marital status: Single    Spouse name: Not on file  . Number of children: Not on file  . Years of education: Not on file  . Highest education level: Not on file  Occupational History  . Not on file  Social Needs  . Financial resource strain: Not on file  . Food insecurity    Worry: Not on file    Inability: Not on file  . Transportation needs    Medical: Not on file    Non-medical: Not on file  Tobacco Use  . Smoking status: Former Smoker    Types: Cigarettes    Quit date: 07/17/1984    Years since quitting: 35.0  . Smokeless tobacco: Never Used  Substance and Sexual Activity  . Alcohol use: Never    Frequency: Never  . Drug use: Never  . Sexual activity: Not on file  Lifestyle  . Physical activity    Days per week: Not on file    Minutes per session: Not on file  . Stress: Not on file  Relationships  . Social Manufacturing systems engineerconnections   Talks on phone: Not on file    Gets together: Not on file    Attends religious service: Not on file    Active member of club or organization: Not on file    Attends meetings of clubs or organizations: Not on file    Relationship status: Not on file  . Intimate partner violence    Fear of current or ex partner: Not on file    Emotionally abused: Not on file    Physically abused: Not on file    Forced sexual activity: Not on file  Other Topics Concern  . Not on file  Social History Narrative  . Not on file    Family History  Problem Relation Age of Onset  . Allergic rhinitis Brother   . Asthma Neg Hx   . Eczema Neg Hx   . Urticaria Neg Hx      Immunization History  Administered Date(s) Administered  . Influenza-Unspecified 11/21/2015, 12/24/2016, 09/20/2017, 09/24/2018  . Pneumococcal Conjugate-13 11/21/2015  . Pneumococcal-Unspecified 09/30/2014  . Td 05/10/2005  . Zoster 12/08/2014    Outpatient Encounter Medications as of 07/23/2019  Medication Sig  .  albuterol (PROVENTIL) (2.5 MG/3ML) 0.083% nebulizer solution USE 3 ML IN NEBULIZER EVERY 6 HOURS AS NEEDED  . ALBUTEROL IN Inhale into the lungs as needed.  . budesonide-formoterol (SYMBICORT) 160-4.5 MCG/ACT inhaler Inhale 2 puffs into the lungs 2 (two) times daily.   . Cholecalciferol (VITAMIN D3) 50 MCG (2000 UT) capsule Take by mouth.  . fenofibrate 160 MG tablet Take 1 tablet (160 mg total) by mouth daily.  . fexofenadine (ALLEGRA) 180 MG tablet Take 180 mg by mouth daily.   . fluticasone (FLONASE) 50 MCG/ACT nasal spray USE 2 SPRAY(S) IN EACH NOSTRIL TWICE DAILY  . montelukast (SINGULAIR) 10 MG tablet TAKE 1 TABLET BY MOUTH ONCE DAILY IN THE EVENING  . Multiple Vitamin (MULTIVITAMIN) capsule Take by mouth.  [name]. omeprazole (PRILOSEC) 20 MG capsule Take 1 capsule (20 mg total) by mouth daily.  . simvastatin (ZOCOR) 40 MG tablet TAKE ONE TABLET BY MOUTH ONCE DAILY IN THE EVENING  . [DISCONTINUED] famotidine (PEPCID) 20 MG  tablet Take 20 mg by mouth 2 (two) times daily.   No facility-administered encounter medications on file as of 07/23/2019.      ROS: Gen: no fever, chills  Skin: no rash, itching ENT: no ear pain, ear drainage, nasal congestion, rhinorrhea, sinus pressure, sore throat Eyes: no blurry vision, double vision Resp: no cough, wheeze,SOB CV: no CP, palpitations, LE edema,  GI: no heartburn, n/v/d/c, abd pain GU: no dysuria, urgency, frequency, hematuria  MSK: no joint pain, myalgias, back pain Neuro: no dizziness, headache, weakness, vertigo Psych: no depression, anxiety, insomnia   Allergies  Allergen Reactions  . Codeine Hives    BP 110/84   Pulse 61   Temp 98.5 F (36.9 C) (Oral)   Ht 5' 2.21" (1.58 m)   Wt 154 lb 9.6 oz (70.1 kg)   SpO2 96%   BMI 28.09 kg/m   Physical Exam  Constitutional: She is oriented to person, place, and time. She appears well-developed and well-nourished. No distress.  HENT:  Head: Normocephalic and atraumatic.  Right Ear: Tympanic membrane and ear canal normal.  Left Ear: Tympanic membrane and ear canal normal.  Nose: Nose normal.  Mouth/Throat: Oropharynx is clear and moist and mucous membranes are normal.  Eyes: Pupils are equal, round, and reactive to light. Conjunctivae are normal.  Neck: Neck supple. No thyromegaly present.  Cardiovascular: Normal rate, regular rhythm, normal heart sounds and intact distal pulses.  No murmur heard. Pulmonary/Chest: Effort normal and breath sounds normal. No respiratory distress. She has no wheezes. She has no rhonchi.  Abdominal: Soft. Bowel sounds are normal. She exhibits no distension and no mass. There is no abdominal tenderness.  Musculoskeletal:        General: No edema.  Lymphadenopathy:    She has no cervical adenopathy.  Neurological: She is alert and oriented to person, place, and time. She exhibits normal muscle tone. Coordination normal.  Skin: Skin is warm and dry.  Psychiatric: She has a  normal mood and affect. Her behavior is normal.     A/P:  1. Screening for osteoporosis - DG Bone Density; Future  2. Annual physical exam - due for mammo in 09/2019 and dexa - referrals placed today - UTD on colonoscopy - due in 2022 - Tdap today, otherwise immunizations UTD - discussed importance of regular CV exercise, healthy diet, adequate sleep - ALT; Future - AST; Future - Basic metabolic panel; Future - Lipid panel; Future - VITAMIN D 25 Hydroxy (Vit-D Deficiency, Fractures); Future - next  CPE in 1 year or sooner PRN  3. Need for Tdap vaccination - Tdap vaccine greater than or equal to 7yo IM  4. Screening for breast cancer - MM DIGITAL SCREENING BILATERAL; Future  5. Moderate persistent asthma without complication - stable, well-controlled - cont current med regimen and regular f/u with pulm, allergy & asthma  6. Need for hepatitis C screening test - Hepatitis C antibody; Future

## 2019-07-24 ENCOUNTER — Encounter: Payer: Self-pay | Admitting: Family Medicine

## 2019-07-24 ENCOUNTER — Other Ambulatory Visit (INDEPENDENT_AMBULATORY_CARE_PROVIDER_SITE_OTHER): Payer: Medicare Other

## 2019-07-24 DIAGNOSIS — Z0000 Encounter for general adult medical examination without abnormal findings: Secondary | ICD-10-CM

## 2019-07-24 DIAGNOSIS — Z1159 Encounter for screening for other viral diseases: Secondary | ICD-10-CM

## 2019-07-24 LAB — BASIC METABOLIC PANEL
BUN: 18 mg/dL (ref 6–23)
CO2: 28 mEq/L (ref 19–32)
Calcium: 9.9 mg/dL (ref 8.4–10.5)
Chloride: 104 mEq/L (ref 96–112)
Creatinine, Ser: 0.76 mg/dL (ref 0.40–1.20)
GFR: 75.07 mL/min (ref >60.00)
Glucose, Bld: 85 mg/dL (ref 70–99)
Potassium: 4.8 mEq/L (ref 3.5–5.1)
Sodium: 140 mEq/L (ref 135–145)

## 2019-07-24 LAB — LIPID PANEL
Cholesterol: 143 mg/dL (ref 0–200)
HDL: 53.9 mg/dL (ref >39.00)
LDL Cholesterol: 74 mg/dL (ref 0–99)
NonHDL: 89.45
Total CHOL/HDL Ratio: 3
Triglycerides: 77 mg/dL (ref 0.0–149.0)
VLDL: 15.4 mg/dL (ref 0.0–40.0)

## 2019-07-24 LAB — ALT: ALT: 12 U/L (ref 0–35)

## 2019-07-24 LAB — AST: AST: 10 U/L (ref 0–37)

## 2019-07-24 LAB — VITAMIN D 25 HYDROXY (VIT D DEFICIENCY, FRACTURES): VITD: 53.76 ng/mL (ref 30.00–100.00)

## 2019-07-24 NOTE — Addendum Note (Signed)
Addended by: Lynnea Ferrier on: 07/24/2019 07:44 AM   Modules accepted: Orders

## 2019-07-25 LAB — HEPATITIS C ANTIBODY: Hep C Virus Ab: <0.1 s/co ratio (ref 0.0–0.9)

## 2019-08-06 NOTE — Progress Notes (Signed)
Follow Up Note  RE: Chamille Werntz MRN: [id number] DOB: 1946/04/25 Date of Office Visit: 08/07/2019  Referring provider: Reita Cliche, MD Primary care provider: Ronnald Nian, DO  Chief Complaint: Asthma  History of Present Illness: I had the pleasure of seeing Oluwatobi Ruppe for a follow up visit at the [facility] on 08/07/2019. She is a 73 y.o. female, who is being followed for asthma, h/o frequent URI, chronic rhinitis. Today she is here for regular follow up visit. Her previous allergy office visit was on 05/01/2019 with Dr. Maudie Mercury via telemedicine.  Moderate persistent asthma without complication Sometimes has coughing in the mornings with some clear mucous. Otherwise, denies any SOB, coughing, wheezing, chest tightness, nocturnal awakenings, ER/urgent care visits or prednisone use since the last visit. No albuterol use since the last visit. Symbicort is still very expensive out of pocket for her. Switched to a new PCP.    History of frequent upper respiratory infection No infections/antibiotics since last OV.   Chronic rhinitis Currently taking Singulair, allegra daily, Flonase 1 spray twice a day.  Doing well on this regimen.   Assessment and Plan: Baneen is a 73 y.o. female with: Moderate persistent asthma without complication Past history - Diagnosed with asthma about 8 years ago but noticing frequent flares the past year requiring almost monthly prednisone and antibiotics since June 2019. Currently on Symbicort 160 2 puffs BID, albuterol prn, Flonase 2 sprays BID, allegra daily and Singulair daily. She follows with pulmonology. ENT evaluation showed reflux and now on PPI. 2019 CXR was unremarkable. No cardiac work up.  Interim history - well-controlled and no flares since last OV. Symbicort is still costly.   Today's spirometry was normal but slightly worse than previous.  . Daily controller medication(s): continue Symbicort 160 2 puffs twice a  day with spacer and rinse mouth afterwards. . Prior to physical activity: May use albuterol rescue inhaler 2 puffs 5 to 15 minutes prior to strenuous physical activities. [name] Rescue medications: May use albuterol rescue inhaler 2 puffs or nebulizer every 4 to 6 hours as needed for shortness of breath, chest tightness, coughing, and wheezing. Monitor frequency of use.  . Repeat spirometry at next visit.  History of frequent upper respiratory infection Past history - 2020 bloodwork - Basic immune evaluation was unremarkable.  Interim history - No infections/antibiotics since last OV.  Keep track of infections.  Get flu shot in the fall.   Chronic rhinitis Past history - Perennial nasal congestion for the past few years. Evaluated by ENT in the past. 2020 testing showed: Mildly positive to mold.  Interim history - Doing well with below regimen.  Continue Singulair daily.   Continue allegra daily.  Continue Flonase to 1 spray twice a day.   Nasal saline spray (i.e., Simply Saline) or nasal saline lavage (i.e., NeilMed) is recommended as needed and prior to medicated nasal sprays.  Get eye exam.   Return in about 2 months (around 10/07/2019).  Diagnostics: Spirometry:  Tracings reviewed. Her effort: Good reproducible efforts. FVC: 2.51L FEV1: 1.83L, 89% predicted FEV1/FVC ratio: 73% Interpretation: Spirometry consistent with normal pattern, slightly worse than previous.  Please see scanned spirometry results for details.  Medication List:  Current Outpatient Medications  Medication Sig Dispense Refill  . albuterol (PROVENTIL) (2.5 MG/3ML) 0.083% nebulizer solution USE 3 ML IN NEBULIZER EVERY 6 HOURS AS NEEDED    . ALBUTEROL IN Inhale into the lungs as needed.    . budesonide-formoterol (SYMBICORT)  160-4.5 MCG/ACT inhaler Inhale 2 puffs into the lungs 2 (two) times daily.     . Cholecalciferol (VITAMIN D3) 50 MCG (2000 UT) capsule Take by mouth.    . fenofibrate 160 MG tablet  Take 1 tablet (160 mg total) by mouth daily. 90 tablet 3  . fexofenadine (ALLEGRA) 180 MG tablet Take 180 mg by mouth daily.     . fluticasone (FLONASE) 50 MCG/ACT nasal spray USE 2 SPRAY(S) IN EACH NOSTRIL TWICE DAILY    . montelukast (SINGULAIR) 10 MG tablet TAKE 1 TABLET BY MOUTH ONCE DAILY IN THE EVENING    . Multiple Vitamin (MULTIVITAMIN) capsule Take by mouth.    [name]. omeprazole (PRILOSEC) 20 MG capsule Take 1 capsule (20 mg total) by mouth daily. 90 capsule 3  . simvastatin (ZOCOR) 40 MG tablet TAKE ONE TABLET BY MOUTH ONCE DAILY IN THE EVENING 90 tablet 3   No current facility-administered medications for this visit.    Allergies: Allergies  Allergen Reactions  . Codeine Hives   I reviewed her past medical history, social history, family history, and environmental history and no significant changes have been reported from previous visit on 05/01/2019.  Review of Systems  Constitutional: Negative for appetite change, chills, fever and unexpected weight change.  HENT: Negative for congestion and rhinorrhea.   Eyes: Negative for itching.  Respiratory: Negative for cough, chest tightness, shortness of breath and wheezing.   Cardiovascular: Negative for chest pain.  Gastrointestinal: Negative for abdominal pain.  Genitourinary: Negative for difficulty urinating.  Skin: Negative for rash.  Allergic/Immunologic: Positive for environmental allergies. Negative for food allergies.  Neurological: Negative for headaches.   Objective: BP 128/82   Pulse 68   Temp (!) 96.4 F (35.8 C) (Temporal)   Resp 16   SpO2 96%  There is no height or weight on file to calculate BMI. Physical Exam  Constitutional: She is oriented to person, place, and time. She appears well-developed and well-nourished.  HENT:  Head: Normocephalic and atraumatic.  Right Ear: External ear normal.  Left Ear: External ear normal.  Nose: Nose normal.  Mouth/Throat: Oropharynx is clear and moist.  Eyes: Conjunctivae  and EOM are normal.  Neck: Neck supple.  Cardiovascular: Normal rate, regular rhythm and normal heart sounds. Exam reveals no gallop and no friction rub.  No murmur heard. Pulmonary/Chest: Effort normal and breath sounds normal. She has no wheezes. She has no rales.  Abdominal: Soft.  Neurological: She is alert and oriented to person, place, and time.  Skin: Skin is warm. No rash noted.  Psychiatric: She has a normal mood and affect. Her behavior is normal.  Nursing note and vitals reviewed.  Previous notes and tests were reviewed. The plan was reviewed with the patient/family, and all questions/concerned were addressed.  It was my pleasure to see [name] today and participate in her care. Please feel free to contact me with any questions or concerns.  Sincerely,  Wyline MoodYoon Takeem Krotzer, DO Allergy & Immunology  [facility] [place] office: [phone] [facility] [place] office: [phone] [place] office: [phone]

## 2019-08-07 ENCOUNTER — Encounter: Payer: Self-pay | Admitting: Allergy

## 2019-08-07 ENCOUNTER — Ambulatory Visit (INDEPENDENT_AMBULATORY_CARE_PROVIDER_SITE_OTHER): Payer: Medicare Other | Admitting: Allergy

## 2019-08-07 ENCOUNTER — Encounter: Payer: Self-pay | Admitting: Family Medicine

## 2019-08-07 ENCOUNTER — Other Ambulatory Visit: Payer: Self-pay

## 2019-08-07 VITALS — BP 128/82 | HR 68 | Temp 96.4°F | Resp 16

## 2019-08-07 DIAGNOSIS — J310 Chronic rhinitis: Secondary | ICD-10-CM | POA: Diagnosis not present

## 2019-08-07 DIAGNOSIS — J4540 Moderate persistent asthma, uncomplicated: Secondary | ICD-10-CM

## 2019-08-07 DIAGNOSIS — Z8709 Personal history of other diseases of the respiratory system: Secondary | ICD-10-CM

## 2019-08-07 NOTE — Assessment & Plan Note (Addendum)
Past history - 2020 bloodwork - Basic immune evaluation was unremarkable.  Interim history - No infections/antibiotics since last OV.  Keep track of infections.  Get flu shot in the fall.

## 2019-08-07 NOTE — Patient Instructions (Addendum)
Moderate persistent asthma without complication  Daily controller medication(s):continue Symbicort 160 2 puffs twice a day with spacer and rinse mouth afterwards.  Prior to physical activity:May use albuterol rescue inhaler 2 puffs 5 to 15 minutes prior to strenuous physical activities.  Rescue medications:May use albuterol rescue inhaler 2 puffs or nebulizer every 4 to 6 hours as needed for shortness of breath, chest tightness, coughing, and wheezing. Monitor frequency of use.  Asthma control goals:  Full participation in all desired activities (may need albuterol before activity) Albuterol use two times or less a week on average (not counting use with activity) Cough interfering with sleep two times or less a month Oral steroids no more than once a year No hospitalizations  History of frequent upper respiratory infection  Keep track of infections.  Chronic rhinitis 2020 testing showed: Mildly positive to mold.   Continue Singulair daily.   Continue allegra daily.  Continue Flonase to 1 spray twice a day.   Nasal saline spray (i.e., Simply Saline) or nasal saline lavage (i.e., NeilMed) is recommended as needed and prior to medicated nasal sprays.  Follow up in 2 months Make sure you get your flu vaccine every year.

## 2019-08-07 NOTE — Assessment & Plan Note (Addendum)
Past history - Diagnosed with asthma about 8 years ago but noticing frequent flares the past year requiring almost monthly prednisone and antibiotics since June 2019. Currently on Symbicort 160 2 puffs BID, albuterol prn, Flonase 2 sprays BID, allegra daily and Singulair daily. She follows with pulmonology. ENT evaluation showed reflux and now on PPI. 2019 CXR was unremarkable. No cardiac work up.  Interim history - well-controlled and no flares since last OV. Symbicort is still costly.   Today's spirometry was normal but slightly worse than previous.  . Daily controller medication(s): continue Symbicort 160 2 puffs twice a day with spacer and rinse mouth afterwards. . Prior to physical activity: May use albuterol rescue inhaler 2 puffs 5 to 15 minutes prior to strenuous physical activities. [name] Rescue medications: May use albuterol rescue inhaler 2 puffs or nebulizer every 4 to 6 hours as needed for shortness of breath, chest tightness, coughing, and wheezing. Monitor frequency of use.  . Repeat spirometry at next visit.

## 2019-08-07 NOTE — Assessment & Plan Note (Addendum)
Past history - Perennial nasal congestion for the past few years. Evaluated by ENT in the past. 2020 testing showed: Mildly positive to mold.  Interim history - Doing well with below regimen.  Continue Singulair daily.   Continue allegra daily.  Continue Flonase to 1 spray twice a day.   Nasal saline spray (i.e., Simply Saline) or nasal saline lavage (i.e., NeilMed) is recommended as needed and prior to medicated nasal sprays.  Get eye exam.

## 2019-08-19 ENCOUNTER — Telehealth: Payer: Self-pay | Admitting: Family Medicine

## 2019-08-19 NOTE — Telephone Encounter (Signed)

## 2019-08-20 ENCOUNTER — Ambulatory Visit (INDEPENDENT_AMBULATORY_CARE_PROVIDER_SITE_OTHER): Payer: Medicare Other

## 2019-08-20 ENCOUNTER — Other Ambulatory Visit: Payer: Self-pay

## 2019-08-20 DIAGNOSIS — Z23 Encounter for immunization: Secondary | ICD-10-CM | POA: Diagnosis not present

## 2019-08-20 NOTE — Progress Notes (Signed)
Pt came in to get high dose flu shot, given to the right deltoid, pt tolerated injection well, information given to the pt.

## 2019-10-06 ENCOUNTER — Other Ambulatory Visit: Payer: Self-pay

## 2019-10-06 ENCOUNTER — Ambulatory Visit
Admission: RE | Admit: 2019-10-06 | Discharge: 2019-10-06 | Disposition: A | Discharge status: Home / Self Care | Payer: Medicare Other | Source: Ambulatory Visit | Attending: Family Medicine | Admitting: Family Medicine

## 2019-10-06 DIAGNOSIS — Z13820 Encounter for screening for osteoporosis: Secondary | ICD-10-CM

## 2019-10-06 DIAGNOSIS — Z1239 Encounter for other screening for malignant neoplasm of breast: Secondary | ICD-10-CM

## 2019-10-07 ENCOUNTER — Encounter: Payer: Self-pay | Admitting: Family Medicine

## 2019-10-15 DIAGNOSIS — J3089 Other allergic rhinitis: Secondary | ICD-10-CM | POA: Insufficient documentation

## 2019-10-15 NOTE — Progress Notes (Deleted)
Follow Up Note  RE: Chloe Hoover MRN: [id number] DOB: 1946/04/11 Date of Office Visit: 10/16/2019  Referring provider: Overton Mam, DO Primary care provider: Overton Mam, DO  Chief Complaint: No chief complaint on file.  History of Present Illness: I had the pleasure of seeing Chloe Hoover for a follow up visit at the [facility] on 10/15/2019. She is a 73 y.o. female, who is being followed for asthma, history of frequent upper respiratory infection, allergic rhinitis. Today she is here for regular follow up visit. Her previous allergy office visit was on 08/07/2019 with [name].   Moderate persistent asthma without complication Past history - Diagnosed with asthma about 8 years ago but noticing frequent flares the past year requiring almost monthly prednisone and antibiotics since June 2019. Currently on Symbicort 160 2 puffs BID, albuterol prn, Flonase 2 sprays BID, allegra daily and Singulair daily. She follows with pulmonology. ENT evaluation showed reflux and now on PPI. 2019 CXR was unremarkable. No cardiac work up.  Interim history - well-controlled and no flares since last OV. Symbicort is still costly.   Today's spirometry was normal but slightly worse than previous.   Daily controller medication(s):continue Symbicort 160 2 puffs twice a day with spacer and rinse mouth afterwards.  Prior to physical activity:May use albuterol rescue inhaler 2 puffs 5 to 15 minutes prior to strenuous physical activities.  Rescue medications:May use albuterol rescue inhaler 2 puffs or nebulizer every 4 to 6 hours as needed for shortness of breath, chest tightness, coughing, and wheezing. Monitor frequency of use.   Repeat spirometry at next visit.  History of frequent upper respiratory infection Past history - 2020 bloodwork - Basic immune evaluation was unremarkable.  Interim history - No infections/antibiotics since last OV.  Keep track of infections.   Get flu shot in the fall.   Chronic rhinitis Past history - Perennial nasal congestion for the past few years. Evaluated by ENT in the past. 2020 testing showed: Mildly positive to mold.  Interim history - Doing well with below regimen.  Continue Singulair daily.   Continue allegra daily.  Continue Flonase to 1 spray twice a day.   Nasal saline spray (i.e., Simply Saline) or nasal saline lavage (i.e., NeilMed) is recommended as needed and prior to medicated nasal sprays.  Get eye exam.   Return in about 2 months (around 10/07/2019).  Assessment and Plan: Chloe Hoover is a 73 y.o. female with: No problem-specific Assessment & Plan notes found for this encounter.  No follow-ups on file.  No orders of the defined types were placed in this encounter.  Lab Orders  No laboratory test(s) ordered today    Diagnostics: Spirometry:  Tracings reviewed. Her effort: {Blank single:19197::"Good reproducible efforts.","It was hard to get consistent efforts and there is a question as to whether this reflects a maximal maneuver.","Poor effort, data can not be interpreted."} FVC: ***L FEV1: ***L, ***% predicted FEV1/FVC ratio: ***% Interpretation: {Blank single:19197::"Spirometry consistent with mild obstructive disease","Spirometry consistent with moderate obstructive disease","Spirometry consistent with severe obstructive disease","Spirometry consistent with possible restrictive disease","Spirometry consistent with mixed obstructive and restrictive disease","Spirometry uninterpretable due to technique","Spirometry consistent with normal pattern","No overt abnormalities noted given today's efforts"}.  Please see scanned spirometry results for details.  Skin Testing: {Blank single:19197::"Select foods","Environmental allergy panel","Environmental allergy panel and select foods","Food allergy panel","None","Deferred due to recent antihistamines use"}. Positive test to: ***. Negative test to: ***.   Results discussed with patient/family.   Medication List:  Current  Outpatient Medications  Medication Sig Dispense Refill  . albuterol (PROVENTIL) (2.5 MG/3ML) 0.083% nebulizer solution USE 3 ML IN NEBULIZER EVERY 6 HOURS AS NEEDED    . ALBUTEROL IN Inhale into the lungs as needed.    . budesonide-formoterol (SYMBICORT) 160-4.5 MCG/ACT inhaler Inhale 2 puffs into the lungs 2 (two) times daily.     . Cholecalciferol (VITAMIN D3) 50 MCG (2000 UT) capsule Take by mouth.    . fenofibrate 160 MG tablet Take 1 tablet (160 mg total) by mouth daily. 90 tablet 3  . fexofenadine (ALLEGRA) 180 MG tablet Take 180 mg by mouth daily.     . fluticasone (FLONASE) 50 MCG/ACT nasal spray USE 2 SPRAY(S) IN EACH NOSTRIL TWICE DAILY    . montelukast (SINGULAIR) 10 MG tablet TAKE 1 TABLET BY MOUTH ONCE DAILY IN THE EVENING    . Multiple Vitamin (MULTIVITAMIN) capsule Take by mouth.    [name] omeprazole (PRILOSEC) 20 MG capsule Take 1 capsule (20 mg total) by mouth daily. 90 capsule 3  . simvastatin (ZOCOR) 40 MG tablet TAKE ONE TABLET BY MOUTH ONCE DAILY IN THE EVENING 90 tablet 3   No current facility-administered medications for this visit.    Allergies: Allergies  Allergen Reactions  . Codeine Hives   I reviewed her past medical history, social history, family history, and environmental history and no significant changes have been reported from her previous visit.  Review of Systems  Constitutional: Negative for appetite change, chills, fever and unexpected weight change.  HENT: Negative for congestion and rhinorrhea.   Eyes: Negative for itching.  Respiratory: Negative for cough, chest tightness, shortness of breath and wheezing.   Cardiovascular: Negative for chest pain.  Gastrointestinal: Negative for abdominal pain.  Genitourinary: Negative for difficulty urinating.  Skin: Negative for rash.  Allergic/Immunologic: Positive for environmental allergies. Negative for food allergies.  Neurological:  Negative for headaches.   Objective: There were no vitals taken for this visit. There is no height or weight on file to calculate BMI. Physical Exam  Constitutional: She is oriented to person, place, and time. She appears well-developed and well-nourished.  HENT:  Head: Normocephalic and atraumatic.  Right Ear: External ear normal.  Left Ear: External ear normal.  Nose: Nose normal.  Mouth/Throat: Oropharynx is clear and moist.  Eyes: Conjunctivae and EOM are normal.  Neck: Neck supple.  Cardiovascular: Normal rate, regular rhythm and normal heart sounds. Exam reveals no gallop and no friction rub.  No murmur heard. Pulmonary/Chest: Effort normal and breath sounds normal. She has no wheezes. She has no rales.  Abdominal: Soft.  Neurological: She is alert and oriented to person, place, and time.  Skin: Skin is warm. No rash noted.  Psychiatric: She has a normal mood and affect. Her behavior is normal.  Nursing note and vitals reviewed.  Previous notes and tests were reviewed. The plan was reviewed with the patient/family, and all questions/concerned were addressed.  It was my pleasure to see Chloe Hoover today and participate in her care. Please feel free to contact me with any questions or concerns.  Sincerely,  Rexene Alberts, DO Allergy & Immunology  [facility] office: [phone] [facility] office: [place] office: [phone]

## 2019-10-16 ENCOUNTER — Ambulatory Visit: Payer: Medicare Other | Admitting: Allergy

## 2019-10-19 ENCOUNTER — Encounter: Payer: Self-pay | Admitting: Family Medicine

## 2019-10-20 ENCOUNTER — Other Ambulatory Visit: Payer: Self-pay | Admitting: *Deleted

## 2019-10-20 MED ORDER — FEXOFENADINE HCL 180 MG PO TABS: 180.0000 mg | ORAL_TABLET | Freq: Every day | ORAL | 3 refills | Status: DC

## 2019-10-20 MED ORDER — FLUTICASONE PROPIONATE 50 MCG/ACT NA SUSP: 2.0000 | Freq: Two times a day (BID) | NASAL | 3 refills | Status: DC

## 2019-10-20 MED ORDER — MONTELUKAST SODIUM 10 MG PO TABS: 10.0000 mg | ORAL_TABLET | Freq: Every evening | ORAL | 3 refills | Status: DC

## 2019-10-29 NOTE — Progress Notes (Signed)
Follow Up Note  RE: Chloe Hoover MRN: [id number] DOB: 10/22/1946 Date of Office Visit: 10/30/2019  Referring provider: Ronnald Nian, DO Primary care provider: Ronnald Nian, DO  Chief Complaint: Asthma  History of Present Illness: I had the pleasure of seeing Chloe Hoover for a follow up visit at the [facility] [place] on 10/30/2019. She is a 73 y.o. female, who is being followed for asthma, history of frequent upper respiratory infection, chronic rhinitis. Today she is here for regular follow up visit. Her previous allergy office visit was on 08/07/2019 with Dr. Maudie Mercury.   Moderate persistent asthma Currently on Symbicort 160 2 puffs twice a day with spacer. Denies any SOB, coughing, wheezing, chest tightness, nocturnal awakenings, ER/urgent care visits or prednisone use since the last visit.  Up to date with flu vaccine.  She is changing her insurance for next year so the Symbicort cost $50/month as now it's over $400/month.  History of frequent upper respiratory infection No infections/antibiotics since last OV.   Chronic rhinitis Some nasal congestion but uses saline solution with good benefit. Still taking Singulair, allegra and Flonase 1 spray twice a day. No nosebleeds.   Had cataracts on eye exam.  Assessment and Plan: Chloe Hoover is a 73 y.o. female with: Moderate persistent asthma without complication Past history - Diagnosed with asthma about 8 years ago but noticing frequent flares the past year requiring almost monthly prednisone and antibiotics since June 2019. Currently on Symbicort 160 2 puffs BID, albuterol prn, Flonase 2 sprays BID, allegra daily and Singulair daily. She follows with pulmonology. ENT evaluation showed reflux and now on PPI. 2019 CXR was unremarkable. No cardiac work up.  Interim history - well-controlled. Symbicort is still costly but it will be better with new insurance in 2021.   Today's spirometry was normal.   Daily  controller medication(s):continue Symbicort 160 2 puffs twice a day with spacer and rinse mouth afterwards.  Prior to physical activity:May use albuterol rescue inhaler 2 puffs 5 to 15 minutes prior to strenuous physical activities.  Rescue medications:May use albuterol rescue inhaler 2 puffs or nebulizer every 4 to 6 hours as needed for shortness of breath, chest tightness, coughing, and wheezing. Monitor frequency of use.  Repeat spirometry at next visit and will consider stepping down therapy if doing well.   History of frequent upper respiratory infection Past history - 2020 bloodwork - Basic immune evaluation was unremarkable.  Interim history - No infections/antibiotics since last OV.  Keep track of infections.  Up to date with flu vaccine.   Other allergic rhinitis Past history - Perennial nasal congestion for the past few years. Evaluated by ENT in the past. 2020 testing showed: Mildly positive to mold.  Interim history - stable.   Continue Singulair 10mg  daily.   Continue allegra 180mg  daily.  Continue Flonase to 1 spray twice a day as needed for nasal congestion.   Nasal saline spray (i.e., Simply Saline) or nasal saline lavage (i.e., NeilMed) is recommended as needed and prior to medicated nasal sprays.  Return in about 4 months (around 02/27/2020).  Meds ordered this encounter  Medications  . budesonide-formoterol (SYMBICORT) 160-4.5 MCG/ACT inhaler    Sig: Inhale 2 puffs into the lungs 2 (two) times daily.    Dispense:  3 Inhaler    Refill:  2    Dispense 90 days supply   Diagnostics: Spirometry:  Tracings reviewed. Her effort: Good reproducible efforts. FVC: 2.62L FEV1: 1.98L, 98% predicted FEV1/FVC  ratio: 76% Interpretation: Spirometry consistent with normal pattern.  Please see scanned spirometry results for details.  Medication List:  Current Outpatient Medications  Medication Sig Dispense Refill  . budesonide-formoterol (SYMBICORT) 160-4.5 MCG/ACT  inhaler Inhale 2 puffs into the lungs 2 (two) times daily. 3 Inhaler 2  . Calcium Carb-Cholecalciferol (CALCIUM 600 + D PO) Take 600 mg by mouth daily.    . Cholecalciferol (VITAMIN D3) 50 MCG (2000 UT) capsule Take by mouth.    . fenofibrate 160 MG tablet Take 1 tablet (160 mg total) by mouth daily. 90 tablet 3  . fexofenadine (ALLEGRA) 180 MG tablet Take 1 tablet (180 mg total) by mouth daily. 30 tablet 3  . fluticasone (FLONASE) 50 MCG/ACT nasal spray Place 2 sprays into both nostrils 2 (two) times daily. 16 g 3  . montelukast (SINGULAIR) 10 MG tablet Take 1 tablet (10 mg total) by mouth every evening. 30 tablet 3  . Multiple Vitamin (MULTIVITAMIN) capsule Take by mouth.    [name] omeprazole (PRILOSEC) 20 MG capsule Take 1 capsule (20 mg total) by mouth daily. 90 capsule 3  . simvastatin (ZOCOR) 40 MG tablet TAKE ONE TABLET BY MOUTH ONCE DAILY IN THE EVENING 90 tablet 3  . albuterol (PROVENTIL) (2.5 MG/3ML) 0.083% nebulizer solution USE 3 ML IN NEBULIZER EVERY 6 HOURS AS NEEDED    . ALBUTEROL IN Inhale into the lungs as needed.     No current facility-administered medications for this visit.    Allergies: Allergies  Allergen Reactions  . Codeine Hives   I reviewed her past medical history, social history, family history, and environmental history and no significant changes have been reported from her previous visit.  Review of Systems  Constitutional: Negative for appetite change, chills, fever and unexpected weight change.  HENT: Negative for congestion and rhinorrhea.   Eyes: Negative for itching.  Respiratory: Negative for cough, chest tightness, shortness of breath and wheezing.   Cardiovascular: Negative for chest pain.  Gastrointestinal: Negative for abdominal pain.  Genitourinary: Negative for difficulty urinating.  Skin: Negative for rash.  Allergic/Immunologic: Positive for environmental allergies. Negative for food allergies.  Neurological: Negative for headaches.    Objective: BP 118/72   Pulse 61   Temp (!) 97 F (36.1 C) (Temporal)   Resp 18   SpO2 97%  There is no height or weight on file to calculate BMI. Physical Exam  Constitutional: She is oriented to person, place, and time. She appears well-developed and well-nourished.  HENT:  Head: Normocephalic and atraumatic.  Right Ear: External ear normal.  Left Ear: External ear normal.  Nose: Nose normal.  Mouth/Throat: Oropharynx is clear and moist.  Eyes: Conjunctivae and EOM are normal.  Neck: Neck supple.  Cardiovascular: Normal rate, regular rhythm and normal heart sounds. Exam reveals no gallop and no friction rub.  No murmur heard. Pulmonary/Chest: Effort normal and breath sounds normal. She has no wheezes. She has no rales.  Abdominal: Soft.  Neurological: She is alert and oriented to person, place, and time.  Skin: Skin is warm. No rash noted.  Psychiatric: She has a normal mood and affect. Her behavior is normal.  Nursing note and vitals reviewed.  Previous notes and tests were reviewed. The plan was reviewed with the patient/family, and all questions/concerned were addressed.  It was my pleasure to see Lawan today and participate in her care. Please feel free to contact me with any questions or concerns.  Sincerely,  Wyline Mood, DO Allergy & Immunology  [facility] [place] office: [phone] [facility] office: [place] office: [phone]

## 2019-10-30 ENCOUNTER — Encounter: Payer: Self-pay | Admitting: Allergy

## 2019-10-30 ENCOUNTER — Other Ambulatory Visit: Payer: Self-pay

## 2019-10-30 ENCOUNTER — Ambulatory Visit (INDEPENDENT_AMBULATORY_CARE_PROVIDER_SITE_OTHER): Payer: Medicare Other | Admitting: Allergy

## 2019-10-30 VITALS — BP 118/72 | HR 61 | Temp 97.0°F | Resp 18

## 2019-10-30 DIAGNOSIS — J3089 Other allergic rhinitis: Secondary | ICD-10-CM

## 2019-10-30 DIAGNOSIS — Z8709 Personal history of other diseases of the respiratory system: Secondary | ICD-10-CM

## 2019-10-30 DIAGNOSIS — J4540 Moderate persistent asthma, uncomplicated: Secondary | ICD-10-CM | POA: Diagnosis not present

## 2019-10-30 MED ORDER — BUDESONIDE-FORMOTEROL FUMARATE 160-4.5 MCG/ACT IN AERO: 2.0000 | INHALATION_SPRAY | Freq: Two times a day (BID) | RESPIRATORY_TRACT | 2 refills | Status: DC

## 2019-10-30 NOTE — Patient Instructions (Addendum)
Moderate persistent asthma without complication  Today's spirometry was normal.  Daily controller medication(s):continue Symbicort 160 2 puffs twice a day with spacer and rinse mouth afterwards.  Prior to physical activity:May use albuterol rescue inhaler 2 puffs 5 to 15 minutes prior to strenuous physical activities.  Rescue medications:May use albuterol rescue inhaler 2 puffs or nebulizer every 4 to 6 hours as needed for shortness of breath, chest tightness, coughing, and wheezing. Monitor frequency of use.  Asthma control goals:  Full participation in all desired activities (may need albuterol before activity) Albuterol use two times or less a week on average (not counting use with activity) Cough interfering with sleep two times or less a month Oral steroids no more than once a year No hospitalizations  History of frequent upper respiratory infection  Keep track of infections.  Chronic rhinitis 2020 testing showed: Mildly positive to mold.   Continue Singulair 10mg  daily.   Continue allegra 180mg  daily.  Continue Flonase to 1 spray twice a day as needed for nasal congestion.   Nasal saline spray (i.e., Simply Saline) or nasal saline lavage (i.e., NeilMed) is recommended as needed and prior to medicated nasal sprays.  Follow up in 4 months or sooner if needed.

## 2019-10-30 NOTE — Assessment & Plan Note (Signed)
Past history - 2020 bloodwork - Basic immune evaluation was unremarkable.  Interim history - No infections/antibiotics since last OV.  Keep track of infections.  Up to date with flu vaccine.

## 2019-10-30 NOTE — Assessment & Plan Note (Signed)
Past history - Diagnosed with asthma about 8 years ago but noticing frequent flares the past year requiring almost monthly prednisone and antibiotics since June 2019. Currently on Symbicort 160 2 puffs BID, albuterol prn, Flonase 2 sprays BID, allegra daily and Singulair daily. She follows with pulmonology. ENT evaluation showed reflux and now on PPI. 2019 CXR was unremarkable. No cardiac work up.  Interim history - well-controlled. Symbicort is still costly but it will be better with new insurance in 2021.   Today's spirometry was normal.   Daily controller medication(s):continue Symbicort 160 2 puffs twice a day with spacer and rinse mouth afterwards.  Prior to physical activity:May use albuterol rescue inhaler 2 puffs 5 to 15 minutes prior to strenuous physical activities.  Rescue medications:May use albuterol rescue inhaler 2 puffs or nebulizer every 4 to 6 hours as needed for shortness of breath, chest tightness, coughing, and wheezing. Monitor frequency of use.  Repeat spirometry at next visit and will consider stepping down therapy if doing well.

## 2019-10-30 NOTE — Assessment & Plan Note (Signed)
Past history - Perennial nasal congestion for the past few years. Evaluated by ENT in the past. 2020 testing showed: Mildly positive to mold.  Interim history - stable.   Continue Singulair 10mg  daily.   Continue allegra 180mg  daily.  Continue Flonase to 1 spray twice a day as needed for nasal congestion.   Nasal saline spray (i.e., Simply Saline) or nasal saline lavage (i.e., NeilMed) is recommended as needed and prior to medicated nasal sprays.

## 2019-11-06 ENCOUNTER — Encounter: Payer: Self-pay | Admitting: Family Medicine

## 2019-11-09 ENCOUNTER — Other Ambulatory Visit: Payer: Self-pay | Admitting: Allergy

## 2019-11-09 NOTE — Telephone Encounter (Signed)
PT called to get symbicort 160 samples. She will need to get a refill shortly but cannot afford it. Also is waiting for [place] to call her back to get financial assistance. Please bring samples from [facility] if possible.

## 2019-11-09 NOTE — Telephone Encounter (Signed)
Will take 2 tomorrow and leave it at the front desk in [place].

## 2019-11-26 ENCOUNTER — Encounter: Payer: Self-pay | Admitting: Family Medicine

## 2019-12-14 ENCOUNTER — Encounter: Payer: Self-pay | Admitting: Family Medicine

## 2019-12-21 ENCOUNTER — Other Ambulatory Visit: Payer: Self-pay

## 2019-12-21 MED ORDER — BUDESONIDE-FORMOTEROL FUMARATE 160-4.5 MCG/ACT IN AERO: 2.0000 | INHALATION_SPRAY | Freq: Two times a day (BID) | RESPIRATORY_TRACT | 0 refills | Status: DC

## 2020-01-11 ENCOUNTER — Encounter: Payer: Self-pay | Admitting: Family Medicine

## 2020-01-30 ENCOUNTER — Ambulatory Visit: Payer: Medicare Other | Attending: Internal Medicine

## 2020-01-30 DIAGNOSIS — Z23 Encounter for immunization: Secondary | ICD-10-CM | POA: Insufficient documentation

## 2020-01-30 NOTE — Progress Notes (Signed)
   Covid-19 Vaccination Clinic  Name:  Chloe Hoover    MRN: [id number] DOB: [birth year]  01/30/2020  [name] was observed post Covid-19 immunization for 15 minutes without incidence. She was provided with Vaccine Information Sheet and instruction to access the V-Safe system.   [name] was instructed to call 911 with any severe reactions post vaccine: [name] Difficulty breathing  . Swelling of your face and throat  . A fast heartbeat  . A bad rash all over your body  . Dizziness and weakness    Immunizations Administered    Name Date Dose VIS Date Route   Pfizer COVID-19 Vaccine 01/30/2020  9:30 AM 0.3 mL 11/27/2019 Intramuscular   Manufacturer: [employer], [employer]   Lot: [id number]   NDC: [address]-6

## 2020-02-05 ENCOUNTER — Ambulatory Visit: Payer: Medicare Other | Admitting: Family Medicine

## 2020-02-20 ENCOUNTER — Other Ambulatory Visit: Payer: Self-pay | Admitting: Allergy

## 2020-02-21 ENCOUNTER — Ambulatory Visit: Payer: Medicare Other | Attending: Internal Medicine

## 2020-02-21 DIAGNOSIS — Z23 Encounter for immunization: Secondary | ICD-10-CM | POA: Insufficient documentation

## 2020-02-21 NOTE — Progress Notes (Signed)
   Covid-19 Vaccination Clinic  Name:  Chloe Hoover    MRN: [id number] DOB: Apr 29, 1946  02/21/2020  Chloe Hoover was observed post Covid-19 immunization for 15 minutes without incident. She was provided with Vaccine Information Sheet and instruction to access the V-Safe system.   Chloe Hoover was instructed to call 911 with any severe reactions post vaccine: [name] Difficulty breathing  . Swelling of face and throat  . A fast heartbeat  . A bad rash all over body  . Dizziness and weakness   Immunizations Administered    Name Date Dose VIS Date Route   Pfizer COVID-19 Vaccine 02/21/2020  2:10 PM 0.3 mL 11/27/2019 Intramuscular   Manufacturer: [employer], [employer]   Lot: [id number]   NDC: [address]-3

## 2020-02-23 ENCOUNTER — Encounter: Payer: Self-pay | Admitting: Family Medicine

## 2020-02-23 ENCOUNTER — Telehealth: Payer: Self-pay | Admitting: Allergy

## 2020-02-24 ENCOUNTER — Other Ambulatory Visit: Payer: Self-pay

## 2020-02-24 MED ORDER — FLUTICASONE PROPIONATE 50 MCG/ACT NA SUSP: 1.0000 | Freq: Two times a day (BID) | NASAL | 0 refills | Status: DC

## 2020-02-24 NOTE — Addendum Note (Signed)
Addended by: Ellamae Sia on: 02/24/2020 08:37 AM   Modules accepted: Orders

## 2020-02-25 ENCOUNTER — Ambulatory Visit (INDEPENDENT_AMBULATORY_CARE_PROVIDER_SITE_OTHER): Payer: Medicare Other | Admitting: Family Medicine

## 2020-02-25 ENCOUNTER — Encounter: Payer: Self-pay | Admitting: Family Medicine

## 2020-02-25 VITALS — BP 128/89 | HR 69 | Temp 96.8°F | Ht 62.21 in | Wt 155.4 lb

## 2020-02-25 DIAGNOSIS — S8392XA Sprain of unspecified site of left knee, initial encounter: Secondary | ICD-10-CM

## 2020-02-25 NOTE — Progress Notes (Signed)
Chloe Hoover is a 74 y.o. female  Chief Complaint  Patient presents with  . Knee Pain    Pt c/o lt knee pain x 3day.  Pt has been wearing a knee brace when she goes out, which has given her some relief .    HPI: Chloe Hoover is a 74 y.o. female complains of 5 day h/o Lt knee pain which started while carrying in 3 gallons of milk in to her house from the car. She is not sure if she twisted knee or stepped awkwardly.  She has been wearing OTC knee sleeve which gives her some relief and stability. No swelling. No bruising. She is able to bear weight. Knee is not giving out. No locking, clicking, catching. No numbness or tingling in LLE.  Pt took generic tylenol for 2 days, not in the past 2 days.  Overall pain improving.    Past Medical History:  Diagnosis Date  . Asthma   . Urticaria     Past Surgical History:  Procedure Laterality Date  . BREAST BIOPSY Bilateral   . BREAST EXCISIONAL BIOPSY Left    20+yrs ago  . FOOT SURGERY    . mylenoma removal      Social History   Socioeconomic History  . Marital status: Single    Spouse name: Not on file  . Number of children: Not on file  . Years of education: Not on file  . Highest education level: Not on file  Occupational History  . Not on file  Tobacco Use  . Smoking status: Former Smoker    Types: Cigarettes    Quit date: 07/17/1984    Years since quitting: 35.6  . Smokeless tobacco: Never Used  Substance and Sexual Activity  . Alcohol use: Never  . Drug use: Never  . Sexual activity: Not on file  Other Topics Concern  . Not on file  Social History Narrative  . Not on file   Social Determinants of Health   Financial Resource Strain:   . Difficulty of Paying Living Expenses:   Food Insecurity:   . Worried About Programme researcher, broadcasting/film/video in the Last Year:   . Barista in the Last Year:   Transportation Needs:   . Freight forwarder (Medical):   [name] Lack of Transportation (Non-Medical):   Physical  Activity:   . Days of Exercise per Week:   . Minutes of Exercise per Session:   Stress:   . Feeling of Stress :   Social Connections:   . Frequency of Communication with Friends and Family:   . Frequency of Social Gatherings with Friends and Family:   . Attends Religious Services:   . Active Member of Clubs or Organizations:   . Attends Banker Meetings:   [name] Marital Status:   Intimate Partner Violence:   . Fear of Current or Ex-Partner:   . Emotionally Abused:   [name] Physically Abused:   . Sexually Abused:     Family History  Problem Relation Age of Onset  . Allergic rhinitis Brother   . Asthma Neg Hx   . Eczema Neg Hx   . Urticaria Neg Hx   . Breast cancer Neg Hx      Immunization History  Administered Date(s) Administered  . Fluad Quad(high Dose 65+) 08/20/2019  . Influenza-Unspecified 11/21/2015, 12/24/2016, 09/20/2017, 09/24/2018  . PFIZER SARS-COV-2 Vaccination 01/30/2020, 02/21/2020  . Pneumococcal Conjugate-13 11/21/2015  . Pneumococcal-Unspecified 09/30/2014  . Td  05/10/2005  . Tdap 07/23/2019  . Zoster 12/08/2014    Outpatient Encounter Medications as of 02/25/2020  Medication Sig  . budesonide-formoterol (SYMBICORT) 160-4.5 MCG/ACT inhaler Inhale 2 puffs into the lungs 2 (two) times daily.  . Calcium Carb-Cholecalciferol (CALCIUM 600 + D PO) Take 600 mg by mouth daily.  . Cholecalciferol (VITAMIN D3) 50 MCG (2000 UT) capsule Take by mouth.  . fenofibrate 160 MG tablet Take 1 tablet (160 mg total) by mouth daily.  . fexofenadine (ALLEGRA) 180 MG tablet Take 1 tablet by mouth daily   . fluticasone (FLONASE) 50 MCG/ACT nasal spray Place 1 spray into both nostrils in the morning and at bedtime.  . montelukast (SINGULAIR) 10 MG tablet TAKE 1 TABLET BY MOUTH ONCE DAILY IN THE EVENING  . Multiple Vitamin (MULTIVITAMIN) capsule Take by mouth.  [name] omeprazole (PRILOSEC) 20 MG capsule Take 1 capsule (20 mg total) by mouth daily.  . simvastatin (ZOCOR) 40 MG  tablet TAKE ONE TABLET BY MOUTH ONCE DAILY IN THE EVENING  . albuterol (PROVENTIL) (2.5 MG/3ML) 0.083% nebulizer solution USE 3 ML IN NEBULIZER EVERY 6 HOURS AS NEEDED  . ALBUTEROL IN Inhale into the lungs as needed.   No facility-administered encounter medications on file as of 02/25/2020.     ROS: Pertinent positives and negatives noted in HPI. Remainder of ROS non-contributory   Allergies  Allergen Reactions  . Codeine Hives    BP 128/89 (BP Location: Left Arm, Patient Position: Sitting, Cuff Size: Normal)   Pulse 69   Temp (!) 96.8 F (36 C) (Temporal)   Ht 5' 2.21" (1.58 m)   Wt 155 lb 6.4 oz (70.5 kg)   SpO2 98%   BMI 28.23 kg/m   Physical Exam  Constitutional: She is oriented to person, place, and time. She appears well-developed and well-nourished. No distress.  Musculoskeletal:        General: No tenderness or edema. Normal range of motion.     Left knee: No swelling, effusion or ecchymosis. Normal range of motion. No tenderness. No LCL laxity or MCL laxity. Neurological: She is alert and oriented to person, place, and time. Coordination normal.  Skin: Skin is warm and dry.  Psychiatric: She has a normal mood and affect. Her behavior is normal.     A/P:  1. Sprain of left knee, unspecified ligament, initial encounter - improving - cont with ice/heat, wear knee sleeve for support for the next few days, tylenol PRN - f/u if symptoms worsen or do not continue to improve over next 2 wks   This visit occurred during the SARS-CoV-2 public health emergency.  Safety protocols were in place, including screening questions prior to the visit, additional usage of staff PPE, and extensive cleaning of exam room while observing appropriate contact time as indicated for disinfecting solutions.

## 2020-03-03 NOTE — Progress Notes (Deleted)
Follow Up Note  RE: Chloe Hoover MRN: [id number] DOB: [birth year] Date of Office Visit: 03/04/2020  Referring provider: Overton Mam, DO Primary care provider: Overton Mam, DO  Chief Complaint: No chief complaint on file.  History of Present Illness: I had the pleasure of seeing Chloe Hoover for a follow up visit at the [facility] on 03/03/2020. She is a 74 y.o. female, who is being followed for asthma, allergic rhinitis and history of frequent upper respiratory infections. Her previous allergy office visit was on 10/30/2019 with [name]. Today is a regular follow up visit.  Moderate persistent asthma without complication Past history - Diagnosed with asthma about 8 years ago but noticing frequent flares the past year requiring almost monthly prednisone and antibiotics since June 2019. Currently on Symbicort 160 2 puffs BID, albuterol prn, Flonase 2 sprays BID, allegra daily and Singulair daily. She follows with pulmonology. ENT evaluation showed reflux and now on PPI. 2019 CXR was unremarkable. No cardiac work up.  Interim history - well-controlled. Symbicort is still costly but it will be better with new insurance in 2021.   Today's spirometry was normal.   Daily controller medication(s):continue Symbicort 160 2 puffs twice a day with spacer and rinse mouth afterwards.  Prior to physical activity:May use albuterol rescue inhaler 2 puffs 5 to 15 minutes prior to strenuous physical activities.  Rescue medications:May use albuterol rescue inhaler 2 puffs or nebulizer every 4 to 6 hours as needed for shortness of breath, chest tightness, coughing, and wheezing. Monitor frequency of use.  Repeat spirometry at next visit and will consider stepping down therapy if doing well.   History of frequent upper respiratory infection Past history - 2020 bloodwork - Basic immune evaluation was unremarkable.  Interim history - No infections/antibiotics since last  OV.  Keep track of infections.  Up to date with flu vaccine.   Other allergic rhinitis Past history - Perennial nasal congestion for the past few years. Evaluated by ENT in the past. 2020 testing showed: Mildly positive to mold.  Interim history - stable.   Continue Singulair 10mg  daily.   Continue allegra 180mg  daily.  ContinueFlonase to 1 spray twice a day as needed for nasal congestion.   Nasal saline spray (i.e., Simply Saline) or nasal saline lavage (i.e., NeilMed) is recommended as needed and prior to medicated nasal sprays.  Return in about 4 months (around 02/27/2020).  Assessment and Plan: Chloe Hoover is a 74 y.o. female with: No problem-specific Assessment & Plan notes found for this encounter.  No follow-ups on file.  No orders of the defined types were placed in this encounter.  Lab Orders  No laboratory test(s) ordered today    Diagnostics: Spirometry:  Tracings reviewed. Her effort: {Blank single:19197::"Good reproducible efforts.","It was hard to get consistent efforts and there is a question as to whether this reflects a maximal maneuver.","Poor effort, data can not be interpreted."} FVC: ***L FEV1: ***L, ***% predicted FEV1/FVC ratio: ***% Interpretation: {Blank single:19197::"Spirometry consistent with mild obstructive disease","Spirometry consistent with moderate obstructive disease","Spirometry consistent with severe obstructive disease","Spirometry consistent with possible restrictive disease","Spirometry consistent with mixed obstructive and restrictive disease","Spirometry uninterpretable due to technique","Spirometry consistent with normal pattern","No overt abnormalities noted given today's efforts"}.  Please see scanned spirometry results for details.  Skin Testing: {Blank single:19197::"Select foods","Environmental allergy panel","Environmental allergy panel and select foods","Food allergy panel","None","Deferred due to recent antihistamines  use"}. Positive test to: ***. Negative test to: ***.  Results discussed with patient/family.  Medication List:  Current Outpatient Medications  Medication Sig Dispense Refill  . albuterol (PROVENTIL) (2.5 MG/3ML) 0.083% nebulizer solution USE 3 ML IN NEBULIZER EVERY 6 HOURS AS NEEDED    . ALBUTEROL IN Inhale into the lungs as needed.    . budesonide-formoterol (SYMBICORT) 160-4.5 MCG/ACT inhaler Inhale 2 puffs into the lungs 2 (two) times daily. 3 Inhaler 0  . Calcium Carb-Cholecalciferol (CALCIUM 600 + D PO) Take 600 mg by mouth daily.    . Cholecalciferol (VITAMIN D3) 50 MCG (2000 UT) capsule Take by mouth.    . fenofibrate 160 MG tablet Take 1 tablet (160 mg total) by mouth daily. 90 tablet 3  . fexofenadine (ALLEGRA) 180 MG tablet Take 1 tablet by mouth daily  30 tablet 5  . fluticasone (FLONASE) 50 MCG/ACT nasal spray Place 1 spray into both nostrils in the morning and at bedtime. 16 g 0  . montelukast (SINGULAIR) 10 MG tablet TAKE 1 TABLET BY MOUTH ONCE DAILY IN THE EVENING 30 tablet 0  . Multiple Vitamin (MULTIVITAMIN) capsule Take by mouth.    [name] omeprazole (PRILOSEC) 20 MG capsule Take 1 capsule (20 mg total) by mouth daily. 90 capsule 3  . simvastatin (ZOCOR) 40 MG tablet TAKE ONE TABLET BY MOUTH ONCE DAILY IN THE EVENING 90 tablet 3   No current facility-administered medications for this visit.   Allergies: Allergies  Allergen Reactions  . Codeine Hives   I reviewed her past medical history, social history, family history, and environmental history and no significant changes have been reported from her previous visit.  Review of Systems  Constitutional: Negative for appetite change, chills, fever and unexpected weight change.  HENT: Negative for congestion and rhinorrhea.   Eyes: Negative for itching.  Respiratory: Negative for cough, chest tightness, shortness of breath and wheezing.   Cardiovascular: Negative for chest pain.  Gastrointestinal: Negative for abdominal  pain.  Genitourinary: Negative for difficulty urinating.  Skin: Negative for rash.  Allergic/Immunologic: Positive for environmental allergies. Negative for food allergies.  Neurological: Negative for headaches.   Objective: There were no vitals taken for this visit. There is no height or weight on file to calculate BMI. Physical Exam  Constitutional: She is oriented to person, place, and time. She appears well-developed and well-nourished.  HENT:  Head: Normocephalic and atraumatic.  Right Ear: External ear normal.  Left Ear: External ear normal.  Nose: Nose normal.  Mouth/Throat: Oropharynx is clear and moist.  Eyes: Conjunctivae and EOM are normal.  Cardiovascular: Normal rate, regular rhythm and normal heart sounds. Exam reveals no gallop and no friction rub.  No murmur heard. Pulmonary/Chest: Effort normal and breath sounds normal. She has no wheezes. She has no rales.  Abdominal: Soft.  Musculoskeletal:     Cervical back: Neck supple.  Neurological: She is alert and oriented to person, place, and time.  Skin: Skin is warm. No rash noted.  Psychiatric: She has a normal mood and affect. Her behavior is normal.  Nursing note and vitals reviewed.  Previous notes and tests were reviewed. The plan was reviewed with the patient/family, and all questions/concerned were addressed.  It was my pleasure to see Chloe Hoover today and participate in her care. Please feel free to contact me with any questions or concerns.  Sincerely,  Rexene Alberts, DO Allergy & Immunology  [facility] office: [phone] [facility] office: [place] office: [phone]

## 2020-03-04 ENCOUNTER — Ambulatory Visit: Payer: Medicare Other | Admitting: Allergy

## 2020-03-10 NOTE — Progress Notes (Signed)
Follow Up Note  RE: Chloe Hoover MRN: [id number] DOB: 1946/06/22 Date of Office Visit: 03/11/2020  Referring provider: Overton Mam, DO Primary care provider: Overton Mam, DO  Chief Complaint: Asthma  History of Present Illness: I had the pleasure of seeing Chloe Hoover for a follow up visit at the [facility] [place] on 03/11/2020. She is a 74 y.o. female, who is being followed for asthma, history of frequent upper respiratory infections and allergic rhinitis. Her previous allergy office visit was on 10/30/2019 with [name]. Today is a regular follow up visit.  Moderate persistent asthma  Some coughing about 1 week ago from PND with nasal discharge in the mornings which is thick but not like when she gets a sinus infection.   Currently on Symbicort 160 2 puffs twice a day and with the insurance it's less than $50. No rescue inhaler use.   Denies any SOB, wheezing, chest tightness, nocturnal awakenings, ER/urgent care visits or prednisone use since the last visit.  History of frequent upper respiratory infection No infections or antibiotics since the last visit.  Patient is fully vaccinated against COVID-19.  Other allergic rhinitis Currently on Flonase 2 sprays twice a day and no nosebleeds. Taking allegra and montelukast daily.  Assessment and Plan: Chloe Hoover is a 74 y.o. female with: Moderate persistent asthma without complication Past history - Diagnosed with asthma about 8 years ago but noticing frequent flares the past year requiring almost monthly prednisone and antibiotics since June 2019. She follows with pulmonology. ENT evaluation showed reflux and now on PPI. 2019 CXR was unremarkable. No cardiac work up.  Interim history - well-controlled.  Symbicort is affordable with her new insurance.  Today's spirometry was normal.   Daily controller medication(s):DECREASE Symbicort 160 to 1 puffs twice a day with spacer and rinse mouth afterwards.  If  noticing symptoms then go back up to 2 puffs.   Prior to physical activity:May use albuterol rescue inhaler 2 puffs 5 to 15 minutes prior to strenuous physical activities.  Rescue medications:May use albuterol rescue inhaler 2 puffs or nebulizer every 4 to 6 hours as needed for shortness of breath, chest tightness, coughing, and wheezing. Monitor frequency of use.  Repeat spirometry at next visit.  Other allergic rhinitis Past history - Perennial nasal congestion for the past few years. Evaluated by ENT in the past. 2020 testing showed: Mildly positive to mold.  Interim history - postnasal drip and nasal discharge for the last week.  Continue Singulair 10mg  daily.   Continue allegra 180mg  daily.  ContinueFlonase to 1 spray twice a day as needed for nasal congestion.   May use azelastine nasal spray 1-2 sprays per nostril twice a day for nasal drainage.   Nasal saline spray (i.e., Simply Saline) or nasal saline lavage (i.e., NeilMed) is recommended as needed and prior to medicated nasal sprays.  History of frequent upper respiratory infection Past history - 2020 bloodwork - Basic immune evaluation was unremarkable.  Interim history - No infections/antibiotics since last OV.  Patient got her COVID-19 vaccine.  Keep track of infections.  Return in about 4 months (around 07/11/2020).  Meds ordered this encounter  Medications  . Azelastine HCl 0.15 % SOLN    Sig: Place 1-2 sprays into the nose 2 (two) times daily as needed (nasal drainage).    Dispense:  30 mL    Refill:  5   Diagnostics: Spirometry:  Tracings reviewed. Her effort: Good reproducible efforts. FVC: 2.61L FEV1:  1.94L, 96% predicted FEV1/FVC ratio: 74% Interpretation: Spirometry consistent with normal pattern.  Please see scanned spirometry results for details.  Medication List:  Current Outpatient Medications  Medication Sig Dispense Refill  . albuterol (PROVENTIL) (2.5 MG/3ML) 0.083% nebulizer solution USE  3 ML IN NEBULIZER EVERY 6 HOURS AS NEEDED    . ALBUTEROL IN Inhale into the lungs as needed.    . budesonide-formoterol (SYMBICORT) 160-4.5 MCG/ACT inhaler Inhale 2 puffs into the lungs 2 (two) times daily. 3 Inhaler 0  . Calcium Carb-Cholecalciferol (CALCIUM 600 + D PO) Take 600 mg by mouth daily.    . Cholecalciferol (VITAMIN D3) 50 MCG (2000 UT) capsule Take by mouth.    . fenofibrate 160 MG tablet Take 1 tablet (160 mg total) by mouth daily. 90 tablet 3  . fexofenadine (ALLEGRA) 180 MG tablet Take 1 tablet by mouth daily  30 tablet 5  . fluticasone (FLONASE) 50 MCG/ACT nasal spray Place 1 spray into both nostrils in the morning and at bedtime. 16 g 0  . montelukast (SINGULAIR) 10 MG tablet TAKE 1 TABLET BY MOUTH ONCE DAILY IN THE EVENING 30 tablet 0  . Multiple Vitamin (MULTIVITAMIN) capsule Take by mouth.    [name] omeprazole (PRILOSEC) 20 MG capsule Take 1 capsule (20 mg total) by mouth daily. 90 capsule 3  . simvastatin (ZOCOR) 40 MG tablet TAKE ONE TABLET BY MOUTH ONCE DAILY IN THE EVENING 90 tablet 3  . Azelastine HCl 0.15 % SOLN Place 1-2 sprays into the nose 2 (two) times daily as needed (nasal drainage). 30 mL 5   No current facility-administered medications for this visit.   Allergies: Allergies  Allergen Reactions  . Codeine Hives   I reviewed her past medical history, social history, family history, and environmental history and no significant changes have been reported from her previous visit.  Review of Systems  Constitutional: Negative for appetite change, chills, fever and unexpected weight change.  HENT: Positive for congestion and postnasal drip. Negative for rhinorrhea.   Eyes: Negative for itching.  Respiratory: Positive for cough. Negative for chest tightness, shortness of breath and wheezing.   Cardiovascular: Negative for chest pain.  Gastrointestinal: Negative for abdominal pain.  Genitourinary: Negative for difficulty urinating.  Skin: Negative for rash.    Allergic/Immunologic: Positive for environmental allergies. Negative for food allergies.  Neurological: Negative for headaches.   Objective: BP 138/82   Pulse 61   Temp (!) 96.7 F (35.9 C) (Temporal)   Resp 20   SpO2 95%  There is no height or weight on file to calculate BMI. Physical Exam  Constitutional: She is oriented to person, place, and time. She appears well-developed and well-nourished.  HENT:  Head: Normocephalic and atraumatic.  Right Ear: External ear normal.  Left Ear: External ear normal.  Cobblestoning of the throat. Whitish nasal discharge on right side.   Eyes: Conjunctivae and EOM are normal.  Cardiovascular: Normal rate, regular rhythm and normal heart sounds. Exam reveals no gallop and no friction rub.  No murmur heard. Pulmonary/Chest: Effort normal and breath sounds normal. She has no wheezes. She has no rales.  Abdominal: Soft.  Musculoskeletal:     Cervical back: Neck supple.  Neurological: She is alert and oriented to person, place, and time.  Skin: Skin is warm. No rash noted.  Psychiatric: She has a normal mood and affect. Her behavior is normal.  Nursing note and vitals reviewed.  Previous notes and tests were reviewed. The plan was reviewed with the patient/family,  and all questions/concerned were addressed.  It was my pleasure to see Chloe Hoover today and participate in her care. Please feel free to contact me with any questions or concerns.  Sincerely,  Rexene Alberts, DO Allergy & Immunology  [facility] office: [phone] [facility] office: [place] office: [phone]

## 2020-03-11 ENCOUNTER — Encounter: Payer: Self-pay | Admitting: Allergy

## 2020-03-11 ENCOUNTER — Ambulatory Visit (INDEPENDENT_AMBULATORY_CARE_PROVIDER_SITE_OTHER): Payer: Medicare Other | Admitting: Allergy

## 2020-03-11 ENCOUNTER — Other Ambulatory Visit: Payer: Self-pay

## 2020-03-11 VITALS — BP 138/82 | HR 61 | Temp 96.7°F | Resp 20

## 2020-03-11 DIAGNOSIS — Z8709 Personal history of other diseases of the respiratory system: Secondary | ICD-10-CM | POA: Diagnosis not present

## 2020-03-11 DIAGNOSIS — J3089 Other allergic rhinitis: Secondary | ICD-10-CM

## 2020-03-11 DIAGNOSIS — J4540 Moderate persistent asthma, uncomplicated: Secondary | ICD-10-CM

## 2020-03-11 MED ORDER — AZELASTINE HCL 0.15 % NA SOLN: 1.0000 | Freq: Two times a day (BID) | NASAL | 5 refills | Status: DC | PRN

## 2020-03-11 NOTE — Assessment & Plan Note (Signed)
Past history - Perennial nasal congestion for the past few years. Evaluated by ENT in the past. 2020 testing showed: Mildly positive to mold.  Interim history - postnasal drip and nasal discharge for the last week.  Continue Singulair 10mg  daily.   Continue allegra 180mg  daily.  ContinueFlonase to 1 spray twice a day as needed for nasal congestion.   May use azelastine nasal spray 1-2 sprays per nostril twice a day for nasal drainage.   Nasal saline spray (i.e., Simply Saline) or nasal saline lavage (i.e., NeilMed) is recommended as needed and prior to medicated nasal sprays.

## 2020-03-11 NOTE — Assessment & Plan Note (Signed)
Past history - 2020 bloodwork - Basic immune evaluation was unremarkable.  Interim history - No infections/antibiotics since last OV.  Patient got her COVID-19 vaccine.  Keep track of infections.

## 2020-03-11 NOTE — Assessment & Plan Note (Signed)
Past history - Diagnosed with asthma about 8 years ago but noticing frequent flares the past year requiring almost monthly prednisone and antibiotics since June 2019. She follows with pulmonology. ENT evaluation showed reflux and now on PPI. 2019 CXR was unremarkable. No cardiac work up.  Interim history - well-controlled.  Symbicort is affordable with her new insurance.  Today's spirometry was normal.   Daily controller medication(s):DECREASE Symbicort 160 to 1 puffs twice a day with spacer and rinse mouth afterwards.  If noticing symptoms then go back up to 2 puffs.   Prior to physical activity:May use albuterol rescue inhaler 2 puffs 5 to 15 minutes prior to strenuous physical activities.  Rescue medications:May use albuterol rescue inhaler 2 puffs or nebulizer every 4 to 6 hours as needed for shortness of breath, chest tightness, coughing, and wheezing. Monitor frequency of use.  Repeat spirometry at next visit.

## 2020-03-11 NOTE — Patient Instructions (Addendum)
Moderate persistent asthma   Today's spirometry was normal.   Daily controller medication(s):DECREASE Symbicort 160 to 1 puffs twice a day with spacer and rinse mouth afterwards.  If noticing symptoms then go back up to 2 puffs.   Prior to physical activity:May use albuterol rescue inhaler 2 puffs 5 to 15 minutes prior to strenuous physical activities.  Rescue medications:May use albuterol rescue inhaler 2 puffs or nebulizer every 4 to 6 hours as needed for shortness of breath, chest tightness, coughing, and wheezing. Monitor frequency of use. Asthma control goals:  Full participation in all desired activities (may need albuterol before activity) Albuterol use two times or less a week on average (not counting use with activity) Cough interfering with sleep two times or less a month Oral steroids no more than once a year No hospitalizations  History of frequent upper respiratory infection  Keep track of infections.  Other allergic rhinitis  Continue Singulair 10mg  daily.   Continue allegra 180mg  daily.  ContinueFlonase to 1 spray twice a day as needed for nasal congestion.   May use azelastine nasal spray 1-2 sprays per nostril twice a day for nasal drainage.   Nasal saline spray (i.e., Simply Saline) or nasal saline lavage (i.e., NeilMed) is recommended as needed and prior to medicated nasal sprays.  Follow up in 4 months or sooner if needed.

## 2020-03-21 ENCOUNTER — Other Ambulatory Visit: Payer: Self-pay | Admitting: Allergy

## 2020-03-21 NOTE — Telephone Encounter (Signed)
Patient states she is coughing up mucous after using additional nasal spray prescribed by Dr Selena Batten.

## 2020-03-21 NOTE — Telephone Encounter (Signed)
[name] please advise:  Patients new nasal spray is Azelastine 1-2 sprays per nostril.   "Patient states she is coughing up mucous after using additional nasal spray prescribed by Dr Selena Batten."

## 2020-03-21 NOTE — Telephone Encounter (Signed)
Pt informed and she will stop Azelastine. She is coughing up a lot of mucus in the morning and states she feels like her "Bronchiols are inflamed." She will call us back if the coughing continues.

## 2020-03-21 NOTE — Telephone Encounter (Signed)
Please call patient and advise her to stop using the nasal spray.  Thank you.

## 2020-03-25 ENCOUNTER — Encounter: Payer: Self-pay | Admitting: Family Medicine

## 2020-03-25 NOTE — Telephone Encounter (Signed)
I spoke with pt and scheduled an appointment for next week.

## 2020-03-30 ENCOUNTER — Other Ambulatory Visit: Payer: Self-pay

## 2020-03-31 ENCOUNTER — Telehealth (INDEPENDENT_AMBULATORY_CARE_PROVIDER_SITE_OTHER): Payer: Medicare Other | Admitting: Family Medicine

## 2020-03-31 ENCOUNTER — Encounter: Payer: Self-pay | Admitting: Family Medicine

## 2020-03-31 VITALS — Ht 62.21 in | Wt 153.0 lb

## 2020-03-31 DIAGNOSIS — J309 Allergic rhinitis, unspecified: Secondary | ICD-10-CM | POA: Diagnosis not present

## 2020-03-31 DIAGNOSIS — J4540 Moderate persistent asthma, uncomplicated: Secondary | ICD-10-CM

## 2020-03-31 NOTE — Patient Instructions (Signed)
Increase nasal saline spray 3-4x/day Switch from allegra to either claritin or zyrtec from allegra  Cont with fluticasone 2 sprays each nostril twice per day mucinex 1 tab twice per day Change clothes and/or shower after being outside Follow-up if no/minimal improvement in 1-2 wks

## 2020-03-31 NOTE — Progress Notes (Signed)
Virtual Visit via Video Note  I connected with Chloe Hoover on 03/31/20 at  9:00 AM EDT by a video enabled telemedicine application and verified that I am speaking with the correct person using two identifiers. Location patient: home Location provider: work  Persons participating in the virtual visit: patient, provider  I discussed the limitations of evaluation and management by telemedicine and the availability of in person appointments. The patient expressed understanding and agreed to proceed.  Chief Complaint  Patient presents with  . Sore Throat    Pt c/o post nasal drip with a tickle in her throat coughing up mucus, off and on starting last Wednesday.    HPI: Chloe Hoover is a 74 y.o. female who complains of PND, tickle in her throat, productive cough or "more like gagging" x 1 week. Symptoms worse late in the day and at night, also after being outside picking up grandson from school or taking him to soccer.  No fever, chills. No sore throat, some irritation. No ear pain or fullness. + nasal congestion. No SOB.  She is taking singulair, allegra, flonase. She is also using symbicort and PRN albuterol.  Dr. Maudie Mercury had prescribed azelastine but this made pts symptoms worse. Pt cannot tolerate allegra D. She took mucinex x 2 days but did not feel it was effective.    Past Medical History:  Diagnosis Date  . Asthma   . Urticaria     Past Surgical History:  Procedure Laterality Date  . BREAST BIOPSY Bilateral   . BREAST EXCISIONAL BIOPSY Left    20+yrs ago  . FOOT SURGERY    . mylenoma removal      Family History  Problem Relation Age of Onset  . Allergic rhinitis Brother   . Asthma Neg Hx   . Eczema Neg Hx   . Urticaria Neg Hx   . Breast cancer Neg Hx     Social History   Tobacco Use  . Smoking status: Former Smoker    Types: Cigarettes    Quit date: 07/17/1984    Years since quitting: 35.7  . Smokeless tobacco: Never Used  Substance Use Topics  . Alcohol use:  Never  . Drug use: Never     Current Outpatient Medications:  .  budesonide-formoterol (SYMBICORT) 160-4.5 MCG/ACT inhaler, Inhale 2 puffs into the lungs 2 (two) times daily., Disp: 3 Inhaler, Rfl: 0 .  Calcium Carb-Cholecalciferol (CALCIUM 600 + D PO), Take 600 mg by mouth daily., Disp: , Rfl:  .  Cholecalciferol (VITAMIN D3) 50 MCG (2000 UT) capsule, Take by mouth., Disp: , Rfl:  .  fenofibrate 160 MG tablet, Take 1 tablet (160 mg total) by mouth daily., Disp: 90 tablet, Rfl: 3 .  fexofenadine (ALLEGRA) 180 MG tablet, Take 1 tablet by mouth daily , Disp: 30 tablet, Rfl: 5 .  fluticasone (FLONASE) 50 MCG/ACT nasal spray, Place 1 spray into both nostrils in the morning and at bedtime., Disp: 16 g, Rfl: 0 .  montelukast (SINGULAIR) 10 MG tablet, TAKE 1 TABLET BY MOUTH ONCE DAILY IN THE EVENING . APPOINTMENT REQUIRED FOR FUTURE REFILLS, Disp: 30 tablet, Rfl: 0 .  Multiple Vitamin (MULTIVITAMIN) capsule, Take by mouth., Disp: , Rfl:  .  omeprazole (PRILOSEC) 20 MG capsule, Take 1 capsule (20 mg total) by mouth daily., Disp: 90 capsule, Rfl: 3 .  simvastatin (ZOCOR) 40 MG tablet, TAKE ONE TABLET BY MOUTH ONCE DAILY IN THE EVENING, Disp: 90 tablet, Rfl: 3 .  albuterol (PROVENTIL) (2.5  MG/3ML) 0.083% nebulizer solution, USE 3 ML IN NEBULIZER EVERY 6 HOURS AS NEEDED, Disp: , Rfl:  .  ALBUTEROL IN, Inhale into the lungs as needed., Disp: , Rfl:  .  Azelastine HCl 0.15 % SOLN, Place 1-2 sprays into the nose 2 (two) times daily as needed (nasal drainage). (Patient not taking: Reported on 03/31/2020), Disp: 30 mL, Rfl: 5  Allergies  Allergen Reactions  . Codeine Hives      ROS: See pertinent positives and negatives per HPI.   EXAM:  VITALS per patient if applicable: Ht 5' 2.21" (1.58 m)   Wt 153 lb (69.4 kg)   BMI 27.80 kg/m    GENERAL: alert, oriented, appears well and in no acute distress  HEENT: atraumatic, conjunctiva clear, no obvious abnormalities on inspection of external nose and  ears  NECK: normal movements of the head and neck  LUNGS: on inspection no signs of respiratory distress, breathing rate appears normal, no obvious gross SOB, gasping or wheezing, no conversational dyspnea  CV: no obvious cyanosis  PSYCH/NEURO: pleasant and cooperative, speech and thought processing grossly intact   ASSESSMENT AND PLAN: 1. Moderate persistent asthma without complication - controlled with symbicort and singulair, has not used albuterol in years  2. Allergic rhinitis, unspecified seasonality, unspecified trigger - cont with flonase, singulair - advised pt to switch from allegra to claritin or zyrtec - add mucinex 1 tab BID x 5-7 days - change clothes and/or shower after being outside - Follow-up if no/minimal improvement in 1-2 wks  I discussed the assessment and treatment plan with the patient. The patient was provided an opportunity to ask questions and all were answered. The patient agreed with the plan and demonstrated an understanding of the instructions.   The patient was advised to call back or seek an in-person evaluation if the symptoms worsen or if the condition fails to improve as anticipated.   Luana Shu, DO

## 2020-04-07 ENCOUNTER — Encounter: Payer: Self-pay | Admitting: Family Medicine

## 2020-04-18 ENCOUNTER — Telehealth: Payer: Self-pay | Admitting: Allergy

## 2020-04-18 NOTE — Telephone Encounter (Signed)
Patient ask for advice with cough, pcp suggested mucinex and claritin. Cough is worse and coughing up mucus, no fever/no wheezing. Please advise

## 2020-04-19 ENCOUNTER — Other Ambulatory Visit: Payer: Self-pay

## 2020-04-19 ENCOUNTER — Ambulatory Visit (INDEPENDENT_AMBULATORY_CARE_PROVIDER_SITE_OTHER): Payer: Medicare Other | Admitting: Family Medicine

## 2020-04-19 ENCOUNTER — Encounter: Payer: Self-pay | Admitting: Family Medicine

## 2020-04-19 VITALS — BP 124/74 | HR 70 | Temp 98.2°F | Resp 16

## 2020-04-19 DIAGNOSIS — J22 Unspecified acute lower respiratory infection: Secondary | ICD-10-CM | POA: Diagnosis not present

## 2020-04-19 DIAGNOSIS — J3089 Other allergic rhinitis: Secondary | ICD-10-CM

## 2020-04-19 DIAGNOSIS — Z8709 Personal history of other diseases of the respiratory system: Secondary | ICD-10-CM | POA: Diagnosis not present

## 2020-04-19 DIAGNOSIS — J4541 Moderate persistent asthma with (acute) exacerbation: Secondary | ICD-10-CM

## 2020-04-19 MED ORDER — ALBUTEROL SULFATE HFA 108 (90 BASE) MCG/ACT IN AERS: 2.0000 | INHALATION_SPRAY | RESPIRATORY_TRACT | 1 refills | Status: DC | PRN

## 2020-04-19 MED ORDER — AZITHROMYCIN 250 MG PO TABS: ORAL_TABLET | ORAL | 0 refills | Status: DC

## 2020-04-19 NOTE — Progress Notes (Signed)
[address] [place] [address] Dept: [phone]  FOLLOW UP NOTE  Patient ID: Chloe Hoover, female    DOB: 05-14-1946  Age: 74 y.o. MRN: [id number] Date of Office Visit: 04/19/2020  Assessment  Chief Complaint: Asthma and Nasal Congestion (feels like a lot of thick mucus in her throat with some creamy, pale color going for 3 wks)  HPI Chloe Hoover is a 74 year old female that presents for an acute visit today.  She was last seen on March 11, 2020 by Dr. Maudie Mercury for moderate persistent asthma, allergic rhinitis, and history of frequent upper respiratory infections.  She reports that about 3 weeks ago she saw her primary care physician and asked about her cough and mucus in her throat.  It was suggested by her primary care physician that she change her Allegra to Claritin since she has been on the Lambert since 2013 and added Mucinex twice a day.  She noticed no improvement in her symptoms.  At the same time she decreased her Symbicort to 1 puff twice a day and her cough was worse so she went back to 2 puffs twice a day. She has not used her albuterol inhaler any since her last appointment. She reports occasional raspy voice, tightness in throat and a rattle.  She denies any wheezing, tightness in her chest, shortness of breath,  fever and chills.  She also reports that she has not been on any systemic steroids since her last office visit or made any trips to the emergency room or urgent care.  She continues on Flonase nasal spray 2 sprays each nostril once a day, Singulair 10 mg once a day Claritin 10 mg once a day and saline nasal rinses at least twice a day.  She reports that azelastine nasal spray made her feel strange. Also, she reports nasal congestion and creamy postnasal drainage. She denies any runny nose.  She currently takes omeprazole once a day and Pepcid once a day for reflux and reports that these two medications control her symptoms.  Drug Allergies:  Allergies    Allergen Reactions  . Codeine Hives    Physical Exam: BP 124/74 (BP Location: Left Arm, Patient Position: Sitting, Cuff Size: Normal)   Pulse 70   Temp 98.2 F (36.8 C) (Oral)   Resp 16   SpO2 96%    Physical Exam Constitutional:      Appearance: Normal appearance.  HENT:     Head: Normocephalic and atraumatic.     Comments: Pharynx normal. Eyes normal. Ears normal. Nose: mildly edematous with clear drainage.    Right Ear: Tympanic membrane, ear canal and external ear normal.     Left Ear: Tympanic membrane, ear canal and external ear normal.     Mouth/Throat:     Mouth: Mucous membranes are moist.     Pharynx: Oropharynx is clear.  Eyes:     Conjunctiva/sclera: Conjunctivae normal.  Cardiovascular:     Rate and Rhythm: Normal rate and regular rhythm.     Heart sounds: Normal heart sounds.  Pulmonary:     Effort: Pulmonary effort is normal.     Breath sounds: Normal breath sounds.     Comments: Lungs clear to asucultation. Skin:    General: Skin is warm and dry.  Neurological:     General: No focal deficit present.     Mental Status: She is alert and oriented to person, place, and time.  Psychiatric:        Mood and  Affect: Mood normal.        Behavior: Behavior normal.        Thought Content: Thought content normal.        Judgment: Judgment normal.     Diagnostics: FVC 2.08 L, FEV1 1.54 L.  Predicted FVC 2.70 L, FEV1 2.03 L. Post bronchodilator response: FVC 2.16 L, FEV1 1.61 L.  No significant bronchodilator response.  Assessment and Plan: 1. Moderate persistent asthma with acute exacerbation   2. Acute respiratory infection   3. Other allergic rhinitis   4. History of frequent upper respiratory infection     Meds ordered this encounter  Medications  . albuterol (PROAIR HFA) 108 (90 Base) MCG/ACT inhaler    Sig: Inhale 2 puffs into the lungs every 4 (four) hours as needed for wheezing or shortness of breath.    Dispense:  18 g    Refill:  1  .  azithromycin (ZITHROMAX) 250 MG tablet    Sig: Take 2 tablets on day one, then take one tablet once a day for 4 days and then stop    Dispense:  6 each    Refill:  0    Patient Instructions  Asthma Continue Symbicort 160- take 2 puffs twice a day with spacer to help prevent cough and wheeze. Rinse mouth out after to help prevent thrush. Continue Singulair 10 mg one a day to help prevent cough and wheeze. May use albuterol 2 puffs every 4 hours as needed to help prevent cough and wheeze. Also may use albuterol 2 puffs 5-15 minutes prior to exercise  Allergic rhinitis Continue Flonase 2 sprays each nostril once a day to help with stuffy nose. Stop Claritin for one week and see if this helps with thick drainage down the throat Stop azelastine nose spray Continue nasal saline spray or nasal saline lavage as needed for nasal symptoms.  Please use this prior to any medicated nasal sprays.  Start prednisone 10 mg taper-2 tablets in office today and 2 tablets later on this evening.  Then on Wednesday and Thursday take 2 tablets at breakfast and 2 tablets at lunch.  On Friday take 2 tablets at breakfast and take 1 tablet at breakfast on Saturday and then stop.  Start Azithromycin 250 mg- take 2 tablets on day one, then take one tablet once a day for 4 days and then stop.  Continue all other current medications. Please let us know if this treatment plan is not working well for you.  Schedule follow up appointment in 2-3 months   Return in about 3 months (around 07/20/2020), or if symptoms worsen or fail to improve.   Thank you for the opportunity to care for this patient.  Please do not hesitate to contact me with questions.  Nehemiah Settle, FNP [facility]   I have provided oversight concerning Chloe Hoover' evaluation and treatment of this patient's health issues addressed during today's encounter. I agree with the assessment and  therapeutic plan as outlined in the note.   Thank you for the opportunity to care for this patient.  Please do not hesitate to contact me with questions.  Tonette Bihari, M.D.  [facility] [address] [place], [place] [address] [phone]

## 2020-04-19 NOTE — Telephone Encounter (Signed)
Pt is schduled for a 1030 today for cough

## 2020-04-19 NOTE — Telephone Encounter (Signed)
Thank you :)

## 2020-04-19 NOTE — Telephone Encounter (Signed)
Lm for pt was going to see if she wanted to be seen via telephone today with anne.

## 2020-04-19 NOTE — Patient Instructions (Addendum)
Asthma Continue Symbicort 160- take 2 puffs twice a day with spacer to help prevent cough and wheeze. Rinse mouth out after to help prevent thrush. Continue Singulair 10 mg one a day to help prevent cough and wheeze. May use albuterol 2 puffs every 4 hours as needed to help prevent cough and wheeze. Also may use albuterol 2 puffs 5-15 minutes prior to exercise  Allergic rhinitis Continue Flonase 2 sprays each nostril once a day to help with stuffy nose. Stop Claritin for one week and see if this helps with thick drainage down the throat Stop azelastine nose spray Continue nasal saline spray or nasal saline lavage as needed for nasal symptoms.  Please use this prior to any medicated nasal sprays.  Start prednisone 10 mg taper-2 tablets in office today and 2 tablets later on this evening.  Then on Wednesday and Thursday take 2 tablets at breakfast and 2 tablets at lunch.  On Friday take 2 tablets at breakfast and take 1 tablet at breakfast on Saturday and then stop.  Start Azithromycin 250 mg- take 2 tablets on day one, then take one tablet once a day for 4 days and then stop.  Continue all other current medications. Please let us know if this treatment plan is not working well for you.  Schedule follow up appointment in 2-3 months

## 2020-04-20 ENCOUNTER — Other Ambulatory Visit: Payer: Self-pay | Admitting: Allergy

## 2020-04-26 ENCOUNTER — Other Ambulatory Visit: Payer: Self-pay | Admitting: Allergy

## 2020-04-26 ENCOUNTER — Other Ambulatory Visit: Payer: Self-pay | Admitting: Family Medicine

## 2020-04-26 ENCOUNTER — Other Ambulatory Visit: Payer: Self-pay

## 2020-04-26 MED ORDER — MONTELUKAST SODIUM 10 MG PO TABS: ORAL_TABLET | ORAL | 5 refills | Status: AC

## 2020-04-26 MED ORDER — FLUTICASONE PROPIONATE 50 MCG/ACT NA SUSP: 1.0000 | Freq: Two times a day (BID) | NASAL | 5 refills | Status: AC

## 2020-04-26 MED ORDER — FLUTICASONE PROPIONATE 50 MCG/ACT NA SUSP: 1.0000 | Freq: Two times a day (BID) | NASAL | 5 refills | Status: DC

## 2020-04-26 MED ORDER — MONTELUKAST SODIUM 10 MG PO TABS: ORAL_TABLET | ORAL | 5 refills | Status: DC

## 2020-04-26 NOTE — Telephone Encounter (Signed)
Patient request refills for fluticasone( nasal spray) and montelukast. Patient had an appt on May 4.  Pharmacy [facility] [address]

## 2020-04-26 NOTE — Telephone Encounter (Signed)
Ok refill montelukast and fluticasone.  Patient seen by Thermon Leyland, FNP on 04/19/2020.  Per chart note.  Continue Singulair 10 mg one a day to help prevent cough and wheeze. Continue Flonase 2 sprays each nostril once a day to help with stuffy nose.  Return in about 3 months (around 07/20/2020), or if symptoms worsen or fail to improve. Sent in rx to [facility]

## 2020-04-26 NOTE — Telephone Encounter (Signed)
Sent in flonase and montelukast refills to pts [facility] pharmacy

## 2020-05-02 ENCOUNTER — Other Ambulatory Visit: Payer: Self-pay | Admitting: Allergy

## 2020-05-03 ENCOUNTER — Telehealth: Payer: Self-pay | Admitting: Family Medicine

## 2020-05-03 ENCOUNTER — Other Ambulatory Visit: Payer: Self-pay

## 2020-05-03 MED ORDER — BUDESONIDE-FORMOTEROL FUMARATE 160-4.5 MCG/ACT IN AERO: 2.0000 | INHALATION_SPRAY | Freq: Two times a day (BID) | RESPIRATORY_TRACT | 1 refills | Status: AC

## 2020-05-03 NOTE — Telephone Encounter (Signed)
PT needing refill for symbicort 160 sent to [facility]

## 2020-05-03 NOTE — Telephone Encounter (Signed)
Refill for symbicort has been sent in to pts pharmacy

## 2020-06-06 ENCOUNTER — Encounter: Payer: Self-pay | Admitting: Family Medicine

## 2020-06-06 ENCOUNTER — Ambulatory Visit (INDEPENDENT_AMBULATORY_CARE_PROVIDER_SITE_OTHER): Payer: Medicare Other | Admitting: Family Medicine

## 2020-06-06 VITALS — BP 126/84 | HR 77 | Temp 98.5°F | Resp 16

## 2020-06-06 DIAGNOSIS — J3089 Other allergic rhinitis: Secondary | ICD-10-CM

## 2020-06-06 DIAGNOSIS — H1010 Acute atopic conjunctivitis, unspecified eye: Secondary | ICD-10-CM | POA: Insufficient documentation

## 2020-06-06 DIAGNOSIS — R05 Cough: Secondary | ICD-10-CM | POA: Diagnosis not present

## 2020-06-06 DIAGNOSIS — J4540 Moderate persistent asthma, uncomplicated: Secondary | ICD-10-CM

## 2020-06-06 DIAGNOSIS — R059 Cough, unspecified: Secondary | ICD-10-CM

## 2020-06-06 MED ORDER — OMEPRAZOLE 20 MG PO CPDR
20.0000 mg | DELAYED_RELEASE_CAPSULE | Freq: Two times a day (BID) | ORAL | 0 refills | Status: DC
Start: 2020-06-06 — End: 2020-08-02

## 2020-06-06 MED ORDER — FAMOTIDINE 20 MG PO TABS: 20.0000 mg | ORAL_TABLET | Freq: Two times a day (BID) | ORAL | 5 refills | Status: AC

## 2020-06-06 NOTE — Patient Instructions (Addendum)
Asthma Continue Symbicort 160- take 2 puffs twice a day with spacer to help prevent cough and wheeze. Rinse mouth out after to help prevent thrush. Continue Singulair 10 mg one a day to help prevent cough and wheeze. May use albuterol 2 puffs every 4 hours as needed to help prevent cough and wheeze. Also may use albuterol 2 puffs 5-15 minutes prior to exercise  Allergic rhinitis Continue Flonase 2 sprays each nostril once a day as needed for a stuffy nose.  In the right nostril, point the applicator out toward the right ear. In the left nostril, point the applicator out toward the left ear Continue azelastine 2 sprays in each nostril twice a day as needed for a runny nose Continue nasal saline spray or nasal saline lavage as needed for nasal symptoms.  Please use this prior to any medicated nasal sprays. For thick post nasal drainage, begin Mucinex [phone] mg twice a day Continue Claritin 10 mg once a day as needed for a runny nose  Reflux Continue omeprazole to 20 mg twice a day. Take 30 minutes before a meal Continue famotidine 20 mg twice a day to control reflux.  These medications may help decrease cough and wheeze Continue dietary and lifestyle modifications as listed below  Continue all other current medications.  Please let us know if this treatment plan is not working well for you.  Follow up in 6 weeks or sooner if needed.   Lifestyle Changes for Controlling GERD When you have GERD, stomach acid feels as if it's backing up toward your mouth. Whether or not you take medication to control your GERD, your symptoms can often be improved with lifestyle changes.   Raise Your Head  Reflux is more likely to strike when you're lying down flat, because stomach fluid can  flow backward more easily. Raising the head of your bed 4-6 inches can help. To do this:  Slide blocks or books under the legs at the head of your bed. Or, place a wedge under  the mattress. Many foam stores can  make a suitable wedge for you. The wedge  should run from your waist to the top of your head.  Don't just prop your head on several pillows. This increases pressure on your  stomach. It can make GERD worse.  Watch Your Eating Habits Certain foods may increase the acid in your stomach or relax the lower esophageal sphincter, making GERD more likely. It's best to avoid the following:  Coffee, tea, and carbonated drinks (with and without caffeine)  Fatty, fried, or spicy food  Mint, chocolate, onions, and tomatoes  Any other foods that seem to irritate your stomach or cause you pain  Relieve the Pressure  Eat smaller meals, even if you have to eat more often.  Don't lie down right after you eat. Wait a few hours for your stomach to empty.  Avoid tight belts and tight-fitting clothes.  Lose excess weight.  Tobacco and Alcohol  Avoid smoking tobacco and drinking alcohol. They can make GERD symptoms worse.

## 2020-06-06 NOTE — Progress Notes (Signed)
[address] [place] [address] Dept: [phone]  FOLLOW UP NOTE  Patient ID: Chloe Hoover, female    DOB: [birth year]  Age: 74 y.o. MRN: [id number] Date of Office Visit: 06/06/2020  Assessment  Chief Complaint: Asthma and Nasal Congestion  HPI Chloe Hoover is a 74 year old female who presents to the clinic for evaluation of cough. She was last seen in this clinic on 04/19/2020 by Dr. Beaulah Dinning for evaluation of asthma, allergic rhinitis, and cough. At today's visit, she reports that she continues to experience increased throat clearing, raspy voice, and cough producing thick clear mucus that she feels is occurring only in her throat, nasal congestion in the morning and at night, and wheeze that is worse in the mornings. She denies fever and sick contacts. She is currently using Flonase, saline nasal spray, Claritin, and occasional use of azelastine. She denies reflux and heartburn or vomiting and continues omeprazole 20 mg twice a day after meals in addition to famotidine 20 mg twice a day. Asthma is reported as moderately well controlled with no shortness of breath. She continues Symbicort 160-2 puffs twice a day with a spacer, montelukast 10 mg once a day, and albuterol before going outside. She continues to wear a mask while outside. Allergic conjunctivitis is reported as moderately well controlled with no current medical intervention. Her current medications are listed in the chart.   Drug Allergies:  Allergies  Allergen Reactions  . Codeine Hives    Physical Exam: BP 126/84 (BP Location: Right Arm, Patient Position: Sitting, Cuff Size: Normal)   Pulse 77   Temp 98.5 F (36.9 C) (Oral)   Resp 16   SpO2 94%    Physical Exam Vitals reviewed.  Constitutional:      Appearance: Normal appearance.  HENT:     Head: Normocephalic and atraumatic.     Right Ear: Tympanic membrane normal.     Left Ear: Tympanic membrane normal.     Nose:     Comments: Bilateral nares  slightly erythematous with clear nasal drainage noted. Pharynx erythematous with no exudate. Ears normal. Eyes normal. Eyes:     Conjunctiva/sclera: Conjunctivae normal.  Cardiovascular:     Rate and Rhythm: Normal rate and regular rhythm.     Heart sounds: Normal heart sounds. No murmur heard.   Pulmonary:     Effort: Pulmonary effort is normal.     Breath sounds: Normal breath sounds.     Comments: Lungs clear to auscultation Musculoskeletal:        General: Normal range of motion.     Cervical back: Normal range of motion and neck supple.  Skin:    General: Skin is warm and dry.  Neurological:     Mental Status: She is alert and oriented to person, place, and time.  Psychiatric:        Mood and Affect: Mood normal.        Behavior: Behavior normal.        Thought Content: Thought content normal.        Judgment: Judgment normal.    Diagnostics: FVC 2.12, FEV1 1.62.  Predicted FVC 2.70, predicted FEV1 2.03.  Spirometry indicates mild restriction.  This is consistent with previous spirometry readings.  Assessment and Plan: 1. Moderate persistent asthma without complication   2. Other allergic rhinitis   3. Seasonal allergic conjunctivitis   4. Cough     Meds ordered this encounter  Medications  . omeprazole (PRILOSEC) 20 MG capsule  Sig: Take 1 capsule (20 mg total) by mouth 2 (two) times daily before a meal.    Dispense:  180 capsule    Refill:  0  . famotidine (PEPCID) 20 MG tablet    Sig: Take 1 tablet (20 mg total) by mouth 2 (two) times daily.    Dispense:  60 tablet    Refill:  5    Patient Instructions  Asthma Continue Symbicort 160- take 2 puffs twice a day with spacer to help prevent cough and wheeze. Rinse mouth out after to help prevent thrush. Continue Singulair 10 mg one a day to help prevent cough and wheeze. May use albuterol 2 puffs every 4 hours as needed to help prevent cough and wheeze. Also may use albuterol 2 puffs 5-15 minutes prior to  exercise  Allergic rhinitis Continue Flonase 2 sprays each nostril once a day as needed for a stuffy nose.  In the right nostril, point the applicator out toward the right ear. In the left nostril, point the applicator out toward the left ear Continue azelastine 2 sprays in each nostril twice a day as needed for a runny nose Continue nasal saline spray or nasal saline lavage as needed for nasal symptoms.  Please use this prior to any medicated nasal sprays. For thick post nasal drainage, begin Mucinex [phone] mg twice a day Continue Claritin 10 mg once a day as needed for a runny nose  Reflux Continue omeprazole to 20 mg twice a day. Take 30 minutes before a meal Continue famotidine 20 mg twice a day to control reflux.  These medications may help decrease cough and wheeze Continue dietary and lifestyle modifications as listed below  Continue all other current medications.  Please let us know if this treatment plan is not working well for you.  Follow up in 6 weeks or sooner if needed.   No follow-ups on file.    Thank you for the opportunity to care for this patient.  Please do not hesitate to contact me with questions.  Gareth Morgan, FNP [facility] [place]

## 2020-06-29 ENCOUNTER — Other Ambulatory Visit: Payer: Self-pay

## 2020-06-29 ENCOUNTER — Encounter: Payer: Self-pay | Admitting: Allergy

## 2020-06-29 ENCOUNTER — Telehealth: Payer: Self-pay | Admitting: Allergy

## 2020-06-29 ENCOUNTER — Ambulatory Visit (INDEPENDENT_AMBULATORY_CARE_PROVIDER_SITE_OTHER): Payer: Medicare Other | Admitting: Allergy

## 2020-06-29 VITALS — BP 118/74 | HR 80 | Temp 98.3°F | Resp 16

## 2020-06-29 DIAGNOSIS — J3089 Other allergic rhinitis: Secondary | ICD-10-CM

## 2020-06-29 DIAGNOSIS — J4541 Moderate persistent asthma with (acute) exacerbation: Secondary | ICD-10-CM | POA: Diagnosis not present

## 2020-06-29 DIAGNOSIS — Z8709 Personal history of other diseases of the respiratory system: Secondary | ICD-10-CM

## 2020-06-29 MED ORDER — SPIRIVA RESPIMAT 1.25 MCG/ACT IN AERS: 2.0000 | INHALATION_SPRAY | Freq: Every day | RESPIRATORY_TRACT | 3 refills | Status: AC

## 2020-06-29 NOTE — Assessment & Plan Note (Signed)
Past history - Perennial nasal congestion for the past few years. Evaluated by ENT in the past. 2020 testing showed: Mildly positive to mold.  Interim history - stable.   Continue Singulair 10mg  daily.   Continue Claritin 10mg  daily.  ContinueFlonase to 1 spray twice a day as needed for nasal congestion.   May use azelastine nasal spray 1-2 sprays per nostril twice a day for nasal drainage.   Nasal saline spray (i.e., Simply Saline) or nasal saline lavage (i.e., NeilMed) is recommended as needed and prior to medicated nasal sprays.

## 2020-06-29 NOTE — Progress Notes (Signed)
Follow Up Note  RE: Chloe Hoover MRN: [id number] DOB: [birth year] Date of Office Visit: 06/29/2020  Referring provider: Overton Mam, DO Primary care provider: Overton Mam, DO  Chief Complaint: Cough  History of Present Illness: I had the pleasure of seeing Chloe Hoover for a follow up visit at the [facility] on 06/29/2020. She is a 74 y.o. female, who is being followed for asthma, allergic rhinitis and reflux. Her previous allergy office visit was on 06/06/2020 with Thermon Leyland, FNP. Today is a new complaint visit of Cough. Up to date with COVID-19 vaccine: yes  Asthma/cough Currently having a cough for the past 3 months and worsening. She is having some beige colored mucous in the mornings and in the afternoons.   Coughing is present throughout the day.  Currently using albuterol prior to going outdoors which helps for about 1 hour. Using albuterol nebulizer 2-3 times a day for the past few months which helps for a few hours. Sometimes has wheezing as well.   Currently on Symbicort 2 puffs twice a day and Singulair daily. No fevers/chills. No URI symptoms. No covid-19 infection.  No recent Chest X-ray.  Took course of prednisone and antibiotics with borderline benefit.   Allergic rhinitis Currently using saline nasal spray, Flonase 2 sprays twice a day. Only using azelastine nasal spray if needed. Mucinex is not helping. No nosebleeds.  Switched from [place] to Claritin with no improvement in symptoms.   Reflux Currently on omeprazole 20mg  in the morning and famotidine 20mg  twice a day with good control.   Assessment and Plan: Chloe Hoover is a 74 y.o. female with: Moderate persistent asthma with acute exacerbation Persistent and worsening coughing for 3 months. Had 1 course of prednisone and azithromycin with minimal benefit. Denies fevers/chills.  ACT score 14.  Today's spirometry showed: mixed obstructive and restrictive disease  with no improvement in FEV1 post bronchodilator treatment. Clinically feeling the same but no more wheezing on physical exam.  Get chest Xray due to prolonged coughing.  Prednisone 10mg  tablet pack: 2 tablets given in office today. Take 2 more tablets before bed today.  Then take 2 tablets twice a day for 2 more days. Then take 2 tablets once a day for 1 day. Then take 1 tablet once a day for 1 day.    Daily controller medication(s): continue with Symbicort 160 to 2 puffs twice a day with spacer and rinse mouth afterwards.  Spacer given and demonstrated proper use with inhaler. Patient understood technique and all questions/concerned were addressed.   Start Spiriva 1.49mcg 2 puffs once a day. Sample given.   Prior to physical activity:May use albuterol rescue inhaler 2 puffs 5 to 15 minutes prior to strenuous physical activities.  Rescue medications:May use albuterol rescue inhaler 2 puffs or nebulizer every 4 to 6 hours as needed for shortness of breath, chest tightness, coughing, and wheezing. Monitor frequency of use.  Repeat spirometry at next visit.   History of frequent upper respiratory infection Past history - 2020 bloodwork - Basic immune evaluation was unremarkable.  Interim history - up to date with COVID-19 vaccine.  Keep track of infections.  Other allergic rhinitis Past history - Perennial nasal congestion for the past few years. Evaluated by ENT in the past. 2020 testing showed: Mildly positive to mold.  Interim history - stable.   Continue Singulair 10mg  daily.   Continue Claritin 10mg  daily.  ContinueFlonase to 1 spray twice a day as needed  for nasal congestion.   May use azelastine nasal spray 1-2 sprays per nostril twice a day for nasal drainage.   Nasal saline spray (i.e., Simply Saline) or nasal saline lavage (i.e., NeilMed) is recommended as needed and prior to medicated nasal sprays.  Return in about 4 weeks (around 07/27/2020).  Meds ordered this  encounter  Medications  . Tiotropium Bromide Monohydrate (SPIRIVA RESPIMAT) 1.25 MCG/ACT AERS    Sig: Inhale 2 puffs into the lungs daily.    Dispense:  4 g    Refill:  3   Diagnostics: Spirometry:  Tracings reviewed. Her effort: It was hard to get consistent efforts and there is a question as to whether this reflects a maximal maneuver. FVC: 1.95L FEV1: 1.33L, 66% predicted FEV1/FVC ratio: 68% Interpretation: Spirometry consistent with mixed obstructive and restrictive disease with no improvement in FEV1 post bronchodilator treatment. Clinically feeling the same but no more wheezing on physical exam.  Please see scanned spirometry results for details.  Medication List:  Current Outpatient Medications  Medication Sig Dispense Refill  . albuterol (PROVENTIL) (2.5 MG/3ML) 0.083% nebulizer solution USE 3 ML IN NEBULIZER EVERY 6 HOURS AS NEEDED    . ALBUTEROL IN Inhale into the lungs as needed.    . budesonide-formoterol (SYMBICORT) 160-4.5 MCG/ACT inhaler Inhale 2 puffs into the lungs 2 (two) times daily. 3 Inhaler 1  . Calcium Carb-Cholecalciferol (CALCIUM 600 + D PO) Take 600 mg by mouth daily.    . Cholecalciferol (VITAMIN D3) 50 MCG (2000 UT) capsule Take by mouth.    . famotidine (PEPCID) 20 MG tablet Take 1 tablet (20 mg total) by mouth 2 (two) times daily. 60 tablet 5  . fenofibrate 160 MG tablet Take 1 tablet (160 mg total) by mouth daily. 90 tablet 3  . fexofenadine (ALLEGRA) 180 MG tablet Take 1 tablet by mouth daily  30 tablet 5  . fluticasone (FLONASE) 50 MCG/ACT nasal spray Place 1 spray into both nostrils in the morning and at bedtime. 16 g 5  . guaiFENesin (MUCINEX) 600 MG 12 hr tablet Take 600 mg by mouth 2 (two) times daily. Takes 2 daily. May quiet the cough, but cough still present     . loratadine (CLARITIN) 10 MG tablet Take 10 mg by mouth daily.    . montelukast (SINGULAIR) 10 MG tablet TAKE 1 TABLET BY MOUTH ONCE DAILY IN THE EVENING 30 tablet 5  . Multiple Vitamin  (MULTIVITAMIN) capsule Take by mouth.    [name] omeprazole (PRILOSEC) 20 MG capsule Take 1 capsule (20 mg total) by mouth daily. 90 capsule 3  . omeprazole (PRILOSEC) 20 MG capsule Take 1 capsule (20 mg total) by mouth 2 (two) times daily before a meal. 180 capsule 0  . simvastatin (ZOCOR) 40 MG tablet TAKE ONE TABLET BY MOUTH ONCE DAILY IN THE EVENING 90 tablet 3  . Tiotropium Bromide Monohydrate (SPIRIVA RESPIMAT) 1.25 MCG/ACT AERS Inhale 2 puffs into the lungs daily. 4 g 3   No current facility-administered medications for this visit.   Allergies: Allergies  Allergen Reactions  . Codeine Hives   I reviewed her past medical history, social history, family history, and environmental history and no significant changes have been reported from her previous visit.  Review of Systems  Constitutional: Negative for appetite change, chills, fever and unexpected weight change.  HENT: Negative for congestion, postnasal drip and rhinorrhea.   Eyes: Negative for itching.  Respiratory: Positive for cough and wheezing. Negative for chest tightness and shortness of breath.  Cardiovascular: Negative for chest pain.  Gastrointestinal: Negative for abdominal pain.  Genitourinary: Negative for difficulty urinating.  Skin: Negative for rash.  Allergic/Immunologic: Positive for environmental allergies. Negative for food allergies.  Neurological: Negative for headaches.   Objective: BP 118/74   Pulse 80   Temp 98.3 F (36.8 C) (Temporal)   Resp 16   SpO2 94%  There is no height or weight on file to calculate BMI. Physical Exam Vitals and nursing note reviewed.  Constitutional:      Appearance: Normal appearance. She is well-developed.  HENT:     Head: Normocephalic and atraumatic.     Right Ear: Tympanic membrane and external ear normal.     Left Ear: Tympanic membrane and external ear normal.     Nose: Nose normal.     Mouth/Throat:     Mouth: Mucous membranes are moist.     Pharynx: Oropharynx  is clear.  Eyes:     Conjunctiva/sclera: Conjunctivae normal.  Cardiovascular:     Rate and Rhythm: Normal rate and regular rhythm.     Heart sounds: Normal heart sounds. No murmur heard.  No friction rub. No gallop.   Pulmonary:     Effort: Pulmonary effort is normal.     Breath sounds: Wheezing present. No rales.     Comments: Wheezing resolved after albuterol treatment.  Musculoskeletal:     Cervical back: Neck supple.  Skin:    General: Skin is warm.     Findings: No rash.  Neurological:     Mental Status: She is alert and oriented to person, place, and time.  Psychiatric:        Behavior: Behavior normal.    Previous notes and tests were reviewed. The plan was reviewed with the patient/family, and all questions/concerned were addressed.  It was my pleasure to see Chloe Hoover today and participate in her care. Please feel free to contact me with any questions or concerns.  Sincerely,  Wyline Mood, DO Allergy & Immunology  [facility] office: [phone] [facility] office: [phone] [place] office: [phone]

## 2020-06-29 NOTE — Telephone Encounter (Signed)
Patient went to get chest x-ray as directed, but the imaging system is down so she will go back tomorrow. FYI.

## 2020-06-29 NOTE — Assessment & Plan Note (Signed)
Persistent and worsening coughing for 3 months. Had 1 course of prednisone and azithromycin with minimal benefit. Denies fevers/chills.  ACT score 14.  Today's spirometry showed: mixed obstructive and restrictive disease with no improvement in FEV1 post bronchodilator treatment. Clinically feeling the same but no more wheezing on physical exam.  Get chest Xray due to prolonged coughing.  Prednisone 10mg  tablet pack: 2 tablets given in office today. Take 2 more tablets before bed today.  Then take 2 tablets twice a day for 2 more days. Then take 2 tablets once a day for 1 day. Then take 1 tablet once a day for 1 day.    Daily controller medication(s): continue with Symbicort 160 to 2 puffs twice a day with spacer and rinse mouth afterwards.  Spacer given and demonstrated proper use with inhaler. Patient understood technique and all questions/concerned were addressed.   Start Spiriva 1.34mcg 2 puffs once a day. Sample given.   Prior to physical activity:May use albuterol rescue inhaler 2 puffs 5 to 15 minutes prior to strenuous physical activities.  Rescue medications:May use albuterol rescue inhaler 2 puffs or nebulizer every 4 to 6 hours as needed for shortness of breath, chest tightness, coughing, and wheezing. Monitor frequency of use.  Repeat spirometry at next visit.

## 2020-06-29 NOTE — Assessment & Plan Note (Signed)
Past history - 2020 bloodwork - Basic immune evaluation was unremarkable.  Interim history - up to date with COVID-19 vaccine.  Keep track of infections.

## 2020-06-29 NOTE — Patient Instructions (Addendum)
Moderate persistent asthma/coughing: Get Xray Prednisone 10mg  tablet pack: 2 tablets given in office today. Take 2 more tablets before bed today.  Then take 2 tablets twice a day for 2 more days. Then take 2 tablets once a day for 1 day. Then take 1 tablet once a day for 1 day.    Daily controller medication(s): continue with Symbicort 160 to 2 puffs twice a day with spacer and rinse mouth afterwards.  Spacer given and demonstrated proper use with inhaler. Patient understood technique and all questions/concerned were addressed.   Start Spiriva 1.61mcg 2 puffs once a day. Sample given.   Prior to physical activity:May use albuterol rescue inhaler 2 puffs 5 to 15 minutes prior to strenuous physical activities.  Rescue medications:May use albuterol rescue inhaler 2 puffs or nebulizer every 4 to 6 hours as needed for shortness of breath, chest tightness, coughing, and wheezing. Monitor frequency of use. Asthma control goals:  Full participation in all desired activities (may need albuterol before activity) Albuterol use two times or less a week on average (not counting use with activity) Cough interfering with sleep two times or less a month Oral steroids no more than once a year No hospitalizations  History of frequent upper respiratory infection  Keep track of infections.  Other allergic rhinitis  Continue Singulair 10mg  daily.   Continue Claritin 10mg  daily.  ContinueFlonase to 1 spray twice a day as needed for nasal congestion.   May use azelastine nasal spray 1-2 sprays per nostril twice a day for nasal drainage.   Nasal saline spray (i.e., Simply Saline) or nasal saline lavage (i.e., NeilMed) is recommended as needed and prior to medicated nasal sprays.  Follow up in 1 month or sooner if needed.

## 2020-06-29 NOTE — Telephone Encounter (Signed)
Noted,  Thank you!

## 2020-06-30 ENCOUNTER — Telehealth: Payer: Self-pay

## 2020-06-30 ENCOUNTER — Ambulatory Visit
Admission: RE | Admit: 2020-06-30 | Discharge: 2020-06-30 | Disposition: A | Discharge status: Home / Self Care | Payer: Medicare Other | Source: Ambulatory Visit | Attending: Allergy | Admitting: Allergy

## 2020-06-30 NOTE — Telephone Encounter (Signed)
I'm going to hold off to make the change. She can finish the Spiriva sample I gave her and she has a follow up in August with me.

## 2020-06-30 NOTE — Telephone Encounter (Signed)
PA submitted for Spiriva respimat 1.25 on covermymeds.

## 2020-06-30 NOTE — Telephone Encounter (Signed)
Insurance denied coverage of the Spiriva. Alternatives given are Atrovent HFA or Incruse Ellipta. Please advise and thank you.

## 2020-06-30 NOTE — Telephone Encounter (Signed)
Unable to reach patient. Left voicemail to inform patient and advised to call us back if she has any questions

## 2020-07-01 ENCOUNTER — Other Ambulatory Visit: Payer: Self-pay | Admitting: Family Medicine

## 2020-07-01 ENCOUNTER — Encounter: Payer: Self-pay | Admitting: Allergy

## 2020-07-01 DIAGNOSIS — J849 Interstitial pulmonary disease, unspecified: Secondary | ICD-10-CM

## 2020-07-04 MED ORDER — SIMVASTATIN 40 MG PO TABS: ORAL_TABLET | ORAL | 3 refills | Status: AC

## 2020-07-04 MED ORDER — FENOFIBRATE 160 MG PO TABS: 160.0000 mg | ORAL_TABLET | Freq: Every day | ORAL | 3 refills | Status: AC

## 2020-07-04 NOTE — Addendum Note (Signed)
Addended by: Osa Craver on: 07/04/2020 11:56 AM   Modules accepted: Orders

## 2020-07-04 NOTE — Telephone Encounter (Signed)
Last VV 03/31/20 Last fill for both meds 07/07/19  #90/3

## 2020-07-04 NOTE — Telephone Encounter (Signed)
Can you place referral for pulmonology to evaluate for interstitial lung disease based on CXR and symptoms?  Thank you.

## 2020-07-04 NOTE — Telephone Encounter (Signed)
Referral ordered to see Pulmonologist. Please schedule patient for Interstitial Lung Disease on chest x-ray.

## 2020-07-08 ENCOUNTER — Ambulatory Visit: Payer: Medicare Other | Admitting: Allergy

## 2020-07-13 ENCOUNTER — Encounter: Payer: Self-pay | Admitting: Allergy

## 2020-07-13 NOTE — Progress Notes (Signed)
Follow Up Note  RE: Chloe Hoover MRN: [id number] DOB: Apr 22, 1946 Date of Office Visit: 07/14/2020  Referring provider: Overton Mam, DO Primary care provider: Overton Mam, DO  Chief Complaint: Asthma  History of Present Illness: I had the pleasure of seeing Chloe Hoover for a follow up visit at the [facility] on 07/14/2020. She is a 74 y.o. female, who is being followed for asthma, h/o frequent URIs, allergic rhinitis. Her previous allergy office visit was on 06/29/2020 with [name]. Today is a new complaint visit of not feeling better.  Moderate persistent asthma  Patient was doing better while on prednisone but once she finished the course, the coughing returned and feeling worn down. There is no mucous production this time.   No fevers or chills. No sick contacts.  Currently on Symbicort 160 2 puffs twice a day. Finished Spiriva and insurance won't pay for it.  Using albuterol nebulizer three times a day which helps for a few hours.  Has pulmonology appointment in August.   History of frequent upper respiratory infection No additional antibiotics since last visit.   Other allergic rhinitis Stable.   Assessment and Plan: Chloe Hoover is a 74 y.o. female with: Moderate persistent asthma with acute exacerbation Improved while on prednisone and now back to coughing, wheezing but no mucous production. No fevers/chills. CXR concerning for interstitial lung disease. Insurance won't cover Spiriva respimat.  Prednisone 10mg  tablet pack: 2 tablets given in office today. Take 2 more tablets before bed today.  Then take 2 tablets twice a day for 2 more days. Then take 2 tablets once a day for 1 day. Then take 1 tablet once a day for 1 day.  For the next 4 weeks do the following:  1. Use albuterol nebulizer in the morning and at night before using your other inhalers. 2. Use Pulmicort (budesonide) 0.5mg  via nebulizer in the morning and at night. 3. Use  Symbicort 2 puffs with spacer and rinse mouth afterwards in the morning and at night. 4. Use Spiriva 2 puffs once a day in the morning.   If can't control symptoms after 2 back to back albuterol nebulizer treatments at home then please go to the ER.  Daily controller medication(s): continue with Symbicort 2 puffs twice a day with spacer and rinse mouth afterwards.  Continue Spiriva 1.24mcg 2 puffs once a day. Samples given.   May use albuterol rescue inhaler 2 puffs or nebulizer every 4 to 6 hours as needed for shortness of breath, chest tightness, coughing, and wheezing. May use albuterol rescue inhaler 2 puffs 5 to 15 minutes prior to strenuous physical activities. Monitor frequency of use.   Keep pulmonology appointment in August.  History of frequent upper respiratory infection Past history - 2020 bloodwork - Basic immune evaluation was unremarkable. Up to date with COVID-19 vaccine.  Keep track of infections.  Other allergic rhinitis Past history - Perennial nasal congestion for the past few years. Evaluated by ENT in the past. 2020 testing showed: Mildly positive to mold.  Interim history - stable.   Continue Singulair 10mg  daily.   Continue Claritin 10mg  daily.  ContinueFlonase to 1 spray twice a day as needed for nasal congestion.   May use azelastine nasal spray 1-2 sprays per nostril twice a day for nasal drainage.   Nasal saline spray (i.e., Simply Saline) or nasal saline lavage (i.e., NeilMed) is recommended as needed and prior to medicated nasal sprays.  Return  in about 6 weeks (around 08/25/2020).  Meds ordered this encounter  Medications  . budesonide (PULMICORT) 0.5 MG/2ML nebulizer solution    Sig: Take 2 mLs (0.5 mg total) by nebulization in the morning and at bedtime.    Dispense:  120 mL    Refill:  2  . albuterol (PROVENTIL) (2.5 MG/3ML) 0.083% nebulizer solution    Sig: Take 3 mLs (2.5 mg total) by nebulization every 6 (six) hours as needed  for wheezing or shortness of breath.    Dispense:  150 mL    Refill:  2   Diagnostics: None.  Medication List:  Current Outpatient Medications  Medication Sig Dispense Refill  . ALBUTEROL IN Inhale into the lungs as needed.    . budesonide-formoterol (SYMBICORT) 160-4.5 MCG/ACT inhaler Inhale 2 puffs into the lungs 2 (two) times daily. 3 Inhaler 1  . Calcium Carb-Cholecalciferol (CALCIUM 600 + D PO) Take 600 mg by mouth daily.    . Cholecalciferol (VITAMIN D3) 50 MCG (2000 UT) capsule Take by mouth.    . famotidine (PEPCID) 20 MG tablet Take 1 tablet (20 mg total) by mouth 2 (two) times daily. 60 tablet 5  . fenofibrate 160 MG tablet Take 1 tablet (160 mg total) by mouth daily. 90 tablet 3  . fexofenadine (ALLEGRA) 180 MG tablet Take 1 tablet by mouth daily  30 tablet 5  . fluticasone (FLONASE) 50 MCG/ACT nasal spray Place 1 spray into both nostrils in the morning and at bedtime. 16 g 5  . guaiFENesin (MUCINEX) 600 MG 12 hr tablet Take 600 mg by mouth 2 (two) times daily. Takes 2 daily. May quiet the cough, but cough still present     . loratadine (CLARITIN) 10 MG tablet Take 10 mg by mouth daily.    . montelukast (SINGULAIR) 10 MG tablet TAKE 1 TABLET BY MOUTH ONCE DAILY IN THE EVENING 30 tablet 5  . Multiple Vitamin (MULTIVITAMIN) capsule Take by mouth.    [name] omeprazole (PRILOSEC) 20 MG capsule Take 1 capsule (20 mg total) by mouth daily. 90 capsule 3  . omeprazole (PRILOSEC) 20 MG capsule Take 1 capsule (20 mg total) by mouth 2 (two) times daily before a meal. 180 capsule 0  . simvastatin (ZOCOR) 40 MG tablet TAKE ONE TABLET BY MOUTH ONCE DAILY IN THE EVENING 90 tablet 3  . Tiotropium Bromide Monohydrate (SPIRIVA RESPIMAT) 1.25 MCG/ACT AERS Inhale 2 puffs into the lungs daily. 4 g 3  . albuterol (PROVENTIL) (2.5 MG/3ML) 0.083% nebulizer solution Take 3 mLs (2.5 mg total) by nebulization every 6 (six) hours as needed for wheezing or shortness of breath. 150 mL 2  . budesonide (PULMICORT)  0.5 MG/2ML nebulizer solution Take 2 mLs (0.5 mg total) by nebulization in the morning and at bedtime. 120 mL 2   No current facility-administered medications for this visit.   Allergies: Allergies  Allergen Reactions  . Codeine Hives   I reviewed her past medical history, social history, family history, and environmental history and no significant changes have been reported from her previous visit.  Review of Systems  Constitutional: Negative for appetite change, chills, fever and unexpected weight change.  HENT: Negative for congestion, postnasal drip and rhinorrhea.   Eyes: Negative for itching.  Respiratory: Positive for cough, chest tightness, shortness of breath and wheezing.   Cardiovascular: Negative for chest pain.  Gastrointestinal: Negative for abdominal pain.  Genitourinary: Negative for difficulty urinating.  Skin: Negative for rash.  Allergic/Immunologic: Positive for environmental allergies. Negative for  food allergies.  Neurological: Negative for headaches.   Objective: BP (!) 130/78   Pulse 94   Temp (!) 97.5 F (36.4 C) (Temporal)   Resp 18   SpO2 92%  There is no height or weight on file to calculate BMI. Physical Exam Vitals and nursing note reviewed.  Constitutional:      Appearance: Normal appearance. She is well-developed.  HENT:     Head: Normocephalic and atraumatic.     Right Ear: Tympanic membrane and external ear normal.     Left Ear: Tympanic membrane and external ear normal.     Nose: Nose normal.     Mouth/Throat:     Mouth: Mucous membranes are moist.     Pharynx: Oropharynx is clear.  Eyes:     Conjunctiva/sclera: Conjunctivae normal.  Cardiovascular:     Rate and Rhythm: Normal rate and regular rhythm.     Heart sounds: Normal heart sounds. No murmur heard.  No friction rub. No gallop.   Pulmonary:     Effort: Pulmonary effort is normal.     Breath sounds: No wheezing, rhonchi or rales.  Musculoskeletal:     Cervical back: Neck  supple.  Skin:    General: Skin is warm.     Findings: No rash.  Neurological:     Mental Status: She is alert and oriented to person, place, and time.  Psychiatric:        Behavior: Behavior normal.    Previous notes and tests were reviewed. The plan was reviewed with the patient/family, and all questions/concerned were addressed.  It was my pleasure to see Mackinzie today and participate in her care. Please feel free to contact me with any questions or concerns.  Sincerely,  Wyline Mood, DO Allergy & Immunology  [facility] office: [phone] [facility] office: [phone] [place] office: [phone]

## 2020-07-14 ENCOUNTER — Other Ambulatory Visit: Payer: Self-pay

## 2020-07-14 ENCOUNTER — Encounter: Payer: Self-pay | Admitting: Allergy

## 2020-07-14 ENCOUNTER — Ambulatory Visit (INDEPENDENT_AMBULATORY_CARE_PROVIDER_SITE_OTHER): Payer: Medicare Other | Admitting: Allergy

## 2020-07-14 VITALS — BP 130/78 | HR 94 | Temp 97.5°F | Resp 18

## 2020-07-14 DIAGNOSIS — J45909 Unspecified asthma, uncomplicated: Secondary | ICD-10-CM | POA: Insufficient documentation

## 2020-07-14 DIAGNOSIS — J3089 Other allergic rhinitis: Secondary | ICD-10-CM | POA: Diagnosis not present

## 2020-07-14 DIAGNOSIS — J4541 Moderate persistent asthma with (acute) exacerbation: Secondary | ICD-10-CM | POA: Diagnosis not present

## 2020-07-14 DIAGNOSIS — Z8709 Personal history of other diseases of the respiratory system: Secondary | ICD-10-CM | POA: Diagnosis not present

## 2020-07-14 MED ORDER — BUDESONIDE 0.5 MG/2ML IN SUSP: 0.5000 mg | Freq: Two times a day (BID) | RESPIRATORY_TRACT | 2 refills | Status: AC

## 2020-07-14 MED ORDER — ALBUTEROL SULFATE (2.5 MG/3ML) 0.083% IN NEBU: 2.5000 mg | INHALATION_SOLUTION | Freq: Four times a day (QID) | RESPIRATORY_TRACT | 2 refills | Status: AC | PRN

## 2020-07-14 NOTE — Assessment & Plan Note (Signed)
Past history - Perennial nasal congestion for the past few years. Evaluated by ENT in the past. 2020 testing showed: Mildly positive to mold.  Interim history - stable.   Continue Singulair 10mg daily.   Continue Claritin 10mg daily.  ContinueFlonase to 1 spray twice a day as needed for nasal congestion.   May use azelastine nasal spray 1-2 sprays per nostril twice a day for nasal drainage.   Nasal saline spray (i.e., Simply Saline) or nasal saline lavage (i.e., NeilMed) is recommended as needed and prior to medicated nasal sprays. 

## 2020-07-14 NOTE — Assessment & Plan Note (Signed)
Improved while on prednisone and now back to coughing, wheezing but no mucous production. No fevers/chills. CXR concerning for interstitial lung disease. Insurance won't cover Spiriva respimat.  Prednisone 10mg  tablet pack: 2 tablets given in office today. Take 2 more tablets before bed today.  Then take 2 tablets twice a day for 2 more days. Then take 2 tablets once a day for 1 day. Then take 1 tablet once a day for 1 day.  For the next 4 weeks do the following:  1. Use albuterol nebulizer in the morning and at night before using your other inhalers. 2. Use Pulmicort (budesonide) 0.5mg  via nebulizer in the morning and at night. 3. Use Symbicort 2 puffs with spacer and rinse mouth afterwards in the morning and at night. 4. Use Spiriva 2 puffs once a day in the morning.   If can't control symptoms after 2 back to back albuterol nebulizer treatments at home then please go to the ER.  Daily controller medication(s): continue with Symbicort 2 puffs twice a day with spacer and rinse mouth afterwards.  Continue Spiriva 1.64mcg 2 puffs once a day. Samples given.   May use albuterol rescue inhaler 2 puffs or nebulizer every 4 to 6 hours as needed for shortness of breath, chest tightness, coughing, and wheezing. May use albuterol rescue inhaler 2 puffs 5 to 15 minutes prior to strenuous physical activities. Monitor frequency of use.   Keep pulmonology appointment in August.

## 2020-07-14 NOTE — Patient Instructions (Signed)
Moderate persistent asthma/coughing: Prednisone 10mg  tablet pack: 2 tablets given in office today. Take 2 more tablets before bed today.  Then take 2 tablets twice a day for 2 more days. Then take 2 tablets once a day for 1 day. Then take 1 tablet once a day for 1 day.   For the next 4 weeks do the following:  1. Use albuterol nebulizer in the morning and at night before using your other inhalers. 2. Use Pulmicort (budesonide) 0.5mg  via nebulizer in the morning and at night. 3. Use Symbicort 2 puffs with spacer and rinse mouth afterwards in the morning and at night. 4. Use Spiriva 2 puffs once a day in the morning. Samples given.   If you can't control your symptoms after 2 back to back albuterol nebulizer treatments at home then please go to the ER.  Maintenance regimen:   Daily controller medication(s): continue with Symbicort 160 to 2 puffs twice a day with spacer and rinse mouth afterwards.  Continue Spiriva 1.34mcg 2 puffs once a day. Sample given.   May use albuterol rescue inhaler 2 puffs or nebulizer every 4 to 6 hours as needed for shortness of breath, chest tightness, coughing, and wheezing. May use albuterol rescue inhaler 2 puffs 5 to 15 minutes prior to strenuous physical activities. Monitor frequency of use.  Asthma control goals:  Full participation in all desired activities (may need albuterol before activity) Albuterol use two times or less a week on average (not counting use with activity) Cough interfering with sleep two times or less a month Oral steroids no more than once a year No hospitalizations  History of frequent upper respiratory infection  Keep track of infections.  Other allergic rhinitis  Continue Singulair 10mg  daily.   Continue Claritin 10mg  daily.  ContinueFlonase to 1 spray twice a day as needed for nasal congestion.   May use azelastine nasal spray 1-2 sprays per nostril twice a day for nasal drainage.   Nasal saline spray (i.e.,  Simply Saline) or nasal saline lavage (i.e., NeilMed) is recommended as needed and prior to medicated nasal sprays.  Follow up after your pulmonology appointment.

## 2020-07-14 NOTE — Assessment & Plan Note (Signed)
Past history - 2020 bloodwork - Basic immune evaluation was unremarkable. Up to date with COVID-19 vaccine.  Keep track of infections.

## 2020-08-01 ENCOUNTER — Other Ambulatory Visit: Payer: Self-pay

## 2020-08-02 ENCOUNTER — Ambulatory Visit (INDEPENDENT_AMBULATORY_CARE_PROVIDER_SITE_OTHER): Payer: Medicare Other | Admitting: Nurse Practitioner

## 2020-08-02 ENCOUNTER — Encounter: Payer: Self-pay | Admitting: Nurse Practitioner

## 2020-08-02 VITALS — BP 130/80 | HR 92 | Temp 97.0°F | Ht 62.25 in | Wt 150.0 lb

## 2020-08-02 DIAGNOSIS — R5383 Other fatigue: Secondary | ICD-10-CM | POA: Diagnosis not present

## 2020-08-02 DIAGNOSIS — M791 Myalgia, unspecified site: Secondary | ICD-10-CM | POA: Diagnosis not present

## 2020-08-02 LAB — CBC
HCT: 38.6 % (ref 36.0–46.0)
Hemoglobin: 12.7 g/dL (ref 12.0–15.0)
MCHC: 33 g/dL (ref 30.0–36.0)
MCV: 84.4 fl (ref 78.0–100.0)
Platelets: 393 10*3/uL (ref 150.0–400.0)
RBC: 4.57 Mil/uL (ref 3.87–5.11)
RDW: 15 % (ref 11.5–15.5)
WBC: 11.4 10*3/uL — ABNORMAL HIGH (ref 4.0–10.5)

## 2020-08-02 LAB — URINALYSIS
Bilirubin Urine: NEGATIVE
Hgb urine dipstick: NEGATIVE
Ketones, ur: NEGATIVE
Leukocytes,Ua: NEGATIVE
Nitrite: NEGATIVE
Specific Gravity, Urine: 1.01 (ref 1.000–1.030)
Total Protein, Urine: NEGATIVE
Urine Glucose: NEGATIVE
Urobilinogen, UA: 0.2 (ref 0.0–1.0)
pH: 7 (ref 5.0–8.0)

## 2020-08-02 LAB — BASIC METABOLIC PANEL
BUN: 10 mg/dL (ref 6–23)
CO2: 25 mEq/L (ref 19–32)
Calcium: 9.3 mg/dL (ref 8.4–10.5)
Chloride: 100 mEq/L (ref 96–112)
Creatinine, Ser: 0.72 mg/dL (ref 0.40–1.20)
GFR: 79.66 mL/min (ref >60.00)
Glucose, Bld: 103 mg/dL — ABNORMAL HIGH (ref 70–99)
Potassium: 4 mEq/L (ref 3.5–5.1)
Sodium: 136 mEq/L (ref 135–145)

## 2020-08-02 LAB — C-REACTIVE PROTEIN: CRP: 16.6 mg/dL (ref 0.5–20.0)

## 2020-08-02 LAB — CK: Total CK: 115 U/L (ref 7–177)

## 2020-08-02 LAB — TSH: TSH: 3.8 u[IU]/mL (ref 0.35–4.50)

## 2020-08-02 LAB — SEDIMENTATION RATE: Sed Rate: 61 mm/hr — ABNORMAL HIGH (ref 0–30)

## 2020-08-02 NOTE — Patient Instructions (Addendum)
Go to lab for blood draw and urine collection. Hold simvastatin for 2days to see if symptoms improve.  spiriva can cause muscle aches. Please discuss this with [name].

## 2020-08-02 NOTE — Progress Notes (Signed)
Subjective:  Patient ID: Chloe Hoover, female    DOB: Dec 25, 1945  Age: 74 y.o. MRN: [id number]  CC: Shoulder Pain (Shoulder pain that goes across both shoulders and starting yesterday traveled up her neck and down both arms. Pt has been taking tylenolol with no relief. Pt states cold compresses have helped some. )  HPI Ms. Braatz reports generalized malaise and fatigue x 2weeks, graudually worsening. She thinks this might be related to new inhaler prescribed 3weeks ago. She denies any sinus congestion or fever or night sweats or CP or worsening SOB/cough or weight loss or change in GU/GI functions or rash or headache  Reviewed past Medical, Social and Family history today.  Outpatient Medications Prior to Visit  Medication Sig Dispense Refill  . albuterol (PROVENTIL) (2.5 MG/3ML) 0.083% nebulizer solution Take 3 mLs (2.5 mg total) by nebulization every 6 (six) hours as needed for wheezing or shortness of breath. 150 mL 2  . ALBUTEROL IN Inhale into the lungs as needed.    . budesonide (PULMICORT) 0.5 MG/2ML nebulizer solution Take 2 mLs (0.5 mg total) by nebulization in the morning and at bedtime. 120 mL 2  . budesonide-formoterol (SYMBICORT) 160-4.5 MCG/ACT inhaler Inhale 2 puffs into the lungs 2 (two) times daily. 3 Inhaler 1  . Calcium Carb-Cholecalciferol (CALCIUM 600 + D PO) Take 600 mg by mouth daily.    . Cholecalciferol (VITAMIN D3) 50 MCG (2000 UT) capsule Take by mouth.    . famotidine (PEPCID) 20 MG tablet Take 1 tablet (20 mg total) by mouth 2 (two) times daily. 60 tablet 5  . fenofibrate 160 MG tablet Take 1 tablet (160 mg total) by mouth daily. 90 tablet 3  . fluticasone (FLONASE) 50 MCG/ACT nasal spray Place 1 spray into both nostrils in the morning and at bedtime. 16 g 5  . guaiFENesin (MUCINEX) 600 MG 12 hr tablet Take 600 mg by mouth 2 (two) times daily. Takes 2 daily. May quiet the cough, but cough still present     . loratadine (CLARITIN) 10 MG tablet Take 10 mg by  mouth daily.    . montelukast (SINGULAIR) 10 MG tablet TAKE 1 TABLET BY MOUTH ONCE DAILY IN THE EVENING 30 tablet 5  . Multiple Vitamin (MULTIVITAMIN) capsule Take by mouth.    [name] omeprazole (PRILOSEC) 20 MG capsule Take 1 capsule (20 mg total) by mouth daily. 90 capsule 3  . simvastatin (ZOCOR) 40 MG tablet TAKE ONE TABLET BY MOUTH ONCE DAILY IN THE EVENING 90 tablet 3  . Tiotropium Bromide Monohydrate (SPIRIVA RESPIMAT) 1.25 MCG/ACT AERS Inhale 2 puffs into the lungs daily. 4 g 3  . fexofenadine (ALLEGRA) 180 MG tablet Take 1 tablet by mouth daily  (Patient not taking: Reported on 08/02/2020) 30 tablet 5  . omeprazole (PRILOSEC) 20 MG capsule Take 1 capsule (20 mg total) by mouth 2 (two) times daily before a meal. (Patient not taking: Reported on 08/02/2020) 180 capsule 0   No facility-administered medications prior to visit.    ROS See HPI  Objective:  BP 130/80 (BP Location: Left Arm, Patient Position: Sitting, Cuff Size: Normal)   Pulse 92   Temp (!) 97 F (36.1 C) (Temporal)   Ht 5' 2.25" (1.581 m)   Wt 150 lb (68 kg)   SpO2 98%   BMI 27.22 kg/m   Physical Exam Constitutional:      General: She is not in acute distress.    Appearance: She is not ill-appearing, toxic-appearing or diaphoretic.  Cardiovascular:     Rate and Rhythm: Normal rate and regular rhythm.     Pulses: Normal pulses.     Heart sounds: Normal heart sounds.  Pulmonary:     Effort: Pulmonary effort is normal.     Breath sounds: Normal breath sounds.  Abdominal:     General: Bowel sounds are normal.     Palpations: Abdomen is soft.     Tenderness: There is no abdominal tenderness.  Musculoskeletal:     Cervical back: Normal range of motion and neck supple.     Right lower leg: No edema.     Left lower leg: No edema.  Neurological:     Mental Status: She is alert and oriented to person, place, and time.  Psychiatric:        Mood and Affect: Mood normal.     Assessment & Plan:  This visit occurred  during the SARS-CoV-2 public health emergency.  Safety protocols were in place, including screening questions prior to the visit, additional usage of staff PPE, and extensive cleaning of exam room while observing appropriate contact time as indicated for disinfecting solutions.   Marjie was seen today for shoulder pain.  Diagnoses and all orders for this visit:  Muscle pain -     CBC -     Basic metabolic panel -     TSH -     CK (Creatine Kinase) -     Sedimentation rate -     C-reactive protein -     Urinalysis  Other fatigue -     CBC -     Basic metabolic panel -     TSH -     CK (Creatine Kinase) -     Sedimentation rate -     C-reactive protein -     Urinalysis    Problem List Items Addressed This Visit    None    Visit Diagnoses    Muscle pain    -  Primary   Relevant Orders   CBC   Basic metabolic panel   TSH   CK (Creatine Kinase)   Sedimentation rate   C-reactive protein   Urinalysis   Other fatigue       Relevant Orders   CBC   Basic metabolic panel   TSH   CK (Creatine Kinase)   Sedimentation rate   C-reactive protein   Urinalysis      Follow-up: No follow-ups on file.  Alysia Penna, NP

## 2020-08-05 ENCOUNTER — Telehealth: Payer: Self-pay | Admitting: Family Medicine

## 2020-08-05 NOTE — Telephone Encounter (Signed)
Patient states that she is still not feeling well and has very bad pain in back/shoulders and gets very winded (saw Claris Gower 08/17). She wants to know what she should do. Please call her back at [phone] and advise.

## 2020-08-08 ENCOUNTER — Ambulatory Visit: Payer: Medicare Other | Admitting: Allergy

## 2020-08-11 NOTE — Telephone Encounter (Signed)
Left voicemail for pt to give [place] a call back an let us know how she is doing.

## 2020-08-15 NOTE — Telephone Encounter (Signed)
Left pt a voicemail stating if she was having any of the symptoms still to call the office back, also sent her a my chart message letting her know we was wanting to touch base and make sure she was ok.

## 2020-08-16 ENCOUNTER — Institutional Professional Consult (permissible substitution): Payer: Medicare Other | Admitting: Internal Medicine

## 2020-08-16 NOTE — Telephone Encounter (Signed)
Pt was notified and she states she has been admitted to the hospital for pneumonia. She also told me she was going back to [facility] that [facility] wasn't for her and told me not to reach out anymore.

## 2020-08-23 ENCOUNTER — Ambulatory Visit: Payer: Medicare Other | Admitting: Allergy

## 2020-10-10 ENCOUNTER — Telehealth: Payer: Self-pay | Admitting: Family Medicine

## 2020-10-10 NOTE — Telephone Encounter (Signed)
Left message for patient to schedule Annual Wellness Visit.  Please schedule with Nurse Health Advisor Martha Stanley, RN at [place]  °

## 2021-03-02 ENCOUNTER — Telehealth: Payer: Self-pay | Admitting: Family Medicine

## 2021-03-02 NOTE — Telephone Encounter (Signed)
Left message for patient to schedule Annual Wellness Visit.  Please schedule with Nurse Health Advisor Martha Stanley, RN at [place]  °

## 2021-08-26 ENCOUNTER — Telehealth: Payer: Self-pay

## 2021-08-26 NOTE — Telephone Encounter (Signed)
Unable to reach pt, Lvm to call back and schedule AWV.

## 2021-09-16 IMAGING — CR DG CHEST 2V
2 series · 2 of 2 positions shown · non-contrast
Comparison: Chest x-ray 01/08/2018.

CLINICAL DATA: 73-year-old female with history of productive cough
for the past 3 months.

EXAM:
CHEST - 2 VIEW

[w chest pa]
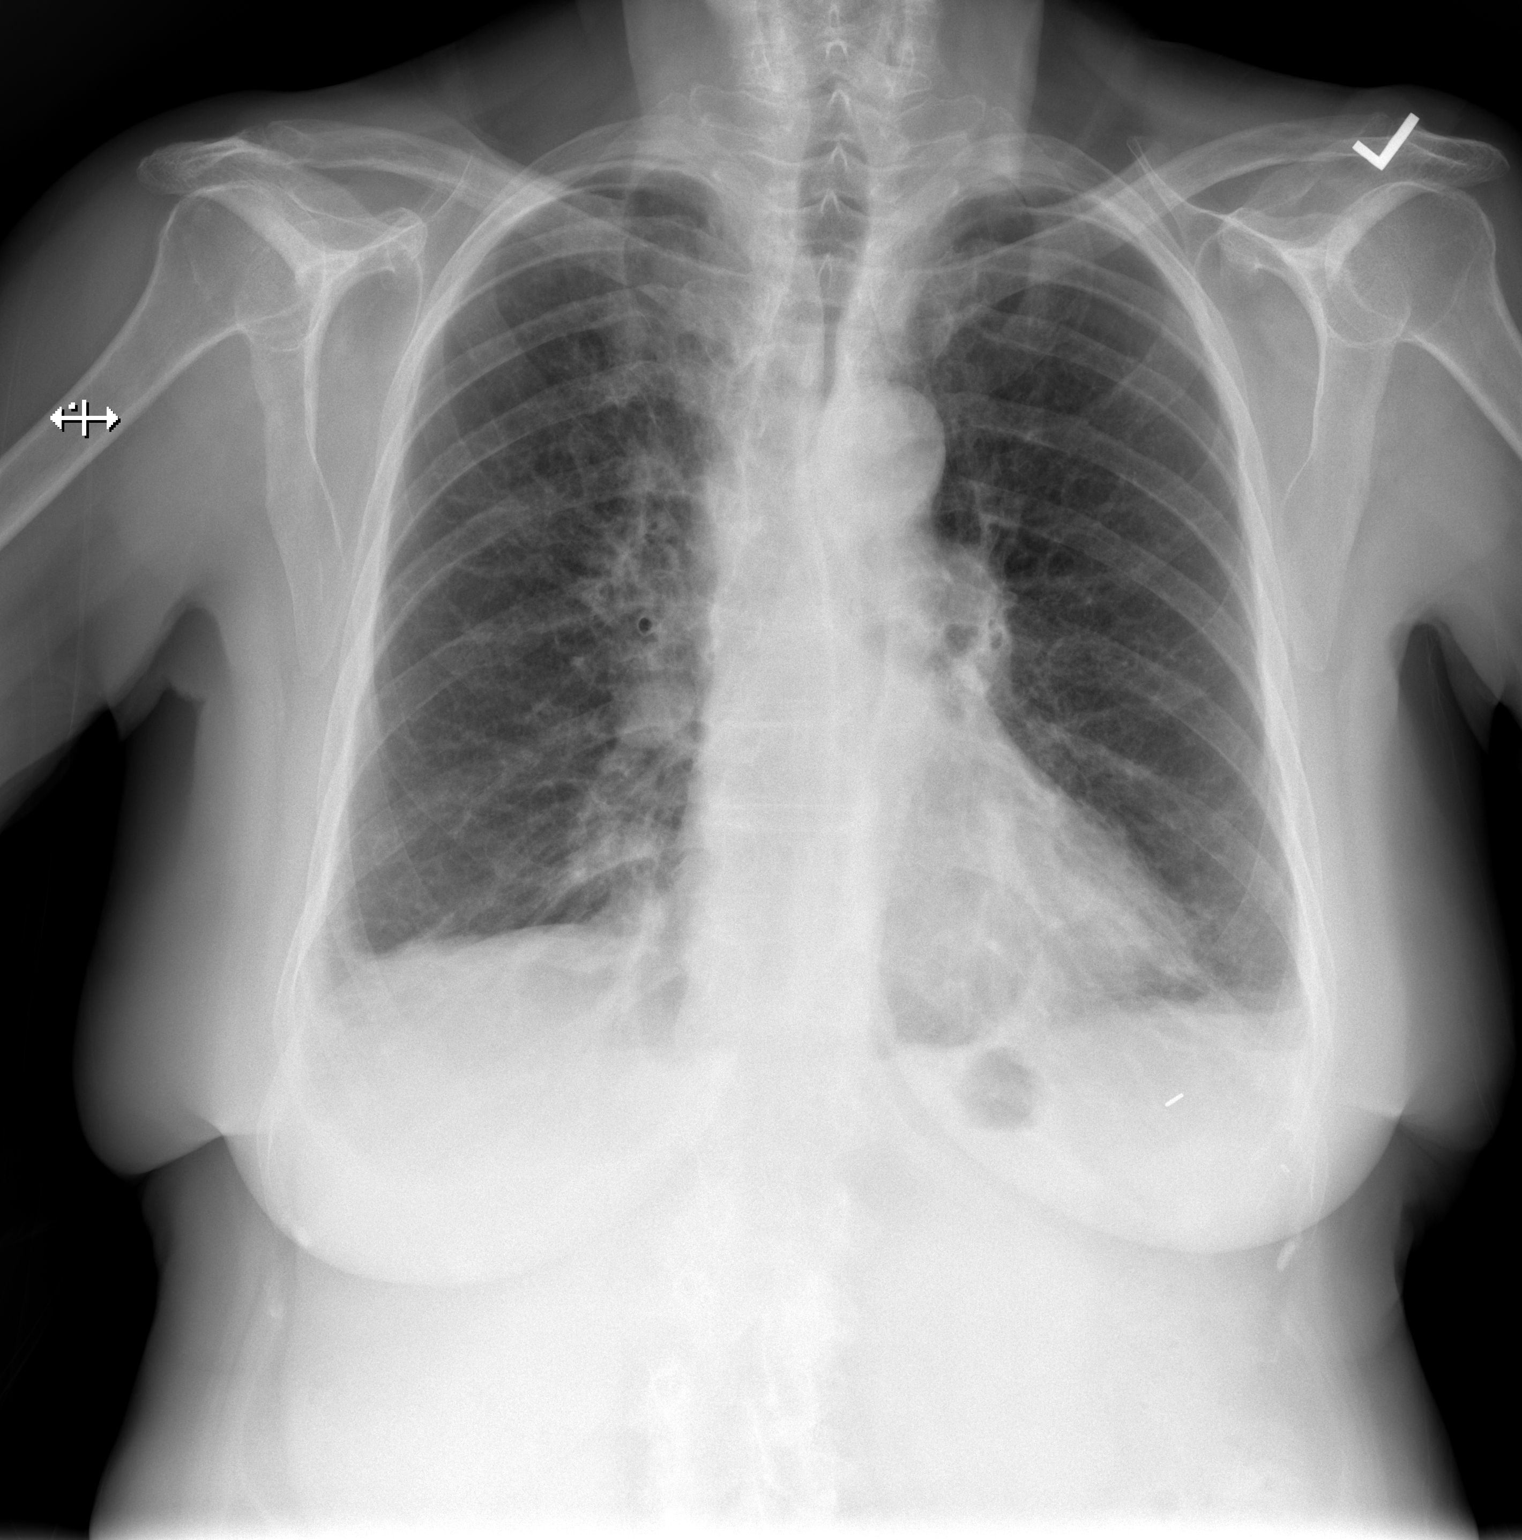

[w chest lat]
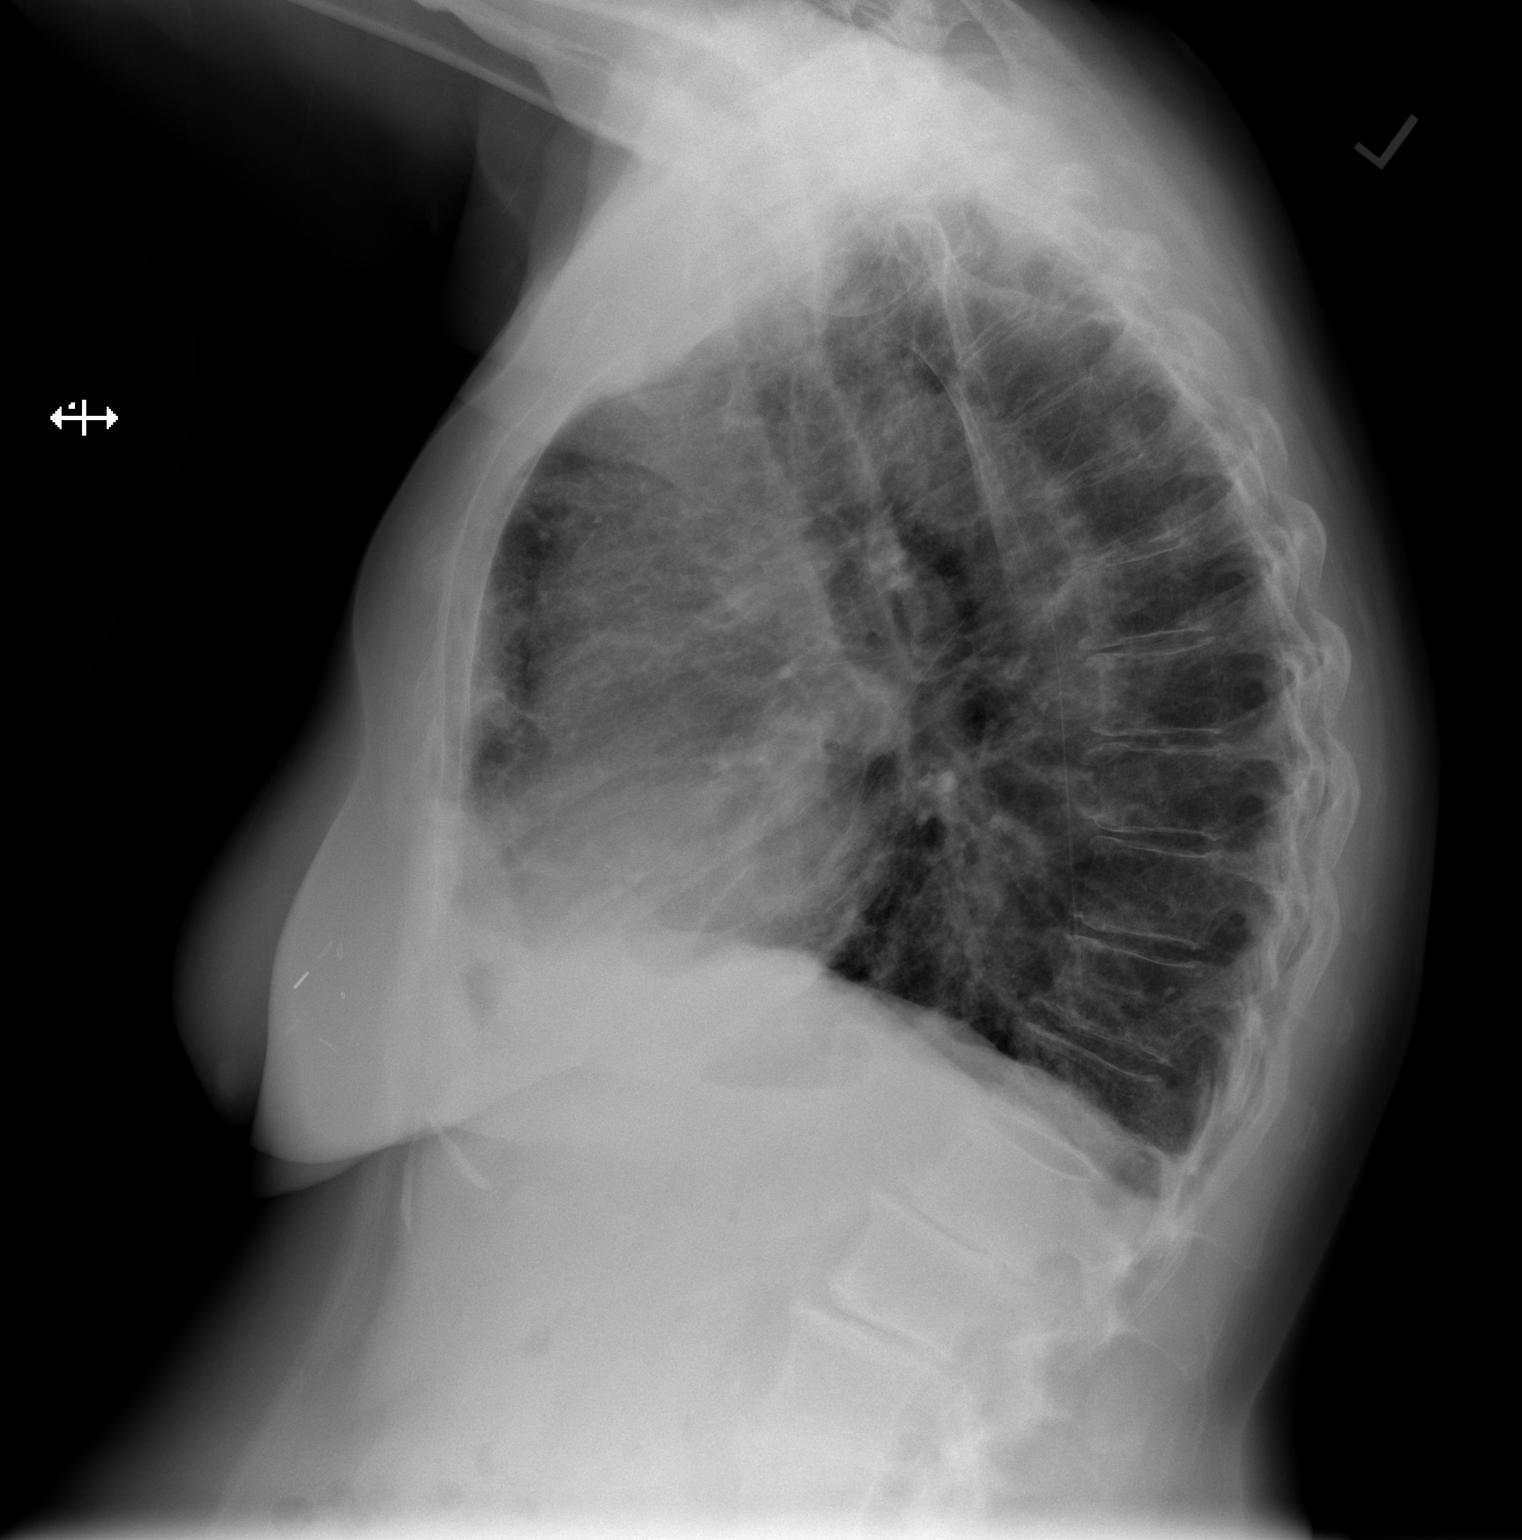

[2 of 2 positions shown; findings below may reference images not displayed]

FINDINGS: Lung volumes are slightly low. Widespread areas of interstitial
prominence in the lungs bilaterally, most evident throughout the
lower lungs. No acute consolidative airspace disease. No pleural
effusions. No evidence of pulmonary edema. Heart size is normal.
Upper mediastinal contours are within normal limits. Aortic
atherosclerosis.
IMPRESSION: 1. Decreasing lung volumes and increasing areas of interstitial
prominence throughout the lungs bilaterally concerning for
developing interstitial lung disease. Follow-up evaluation with
high-resolution chest CT is suggested to better evaluate these
findings.
2. Aortic atherosclerosis.
